# Patient Record
Sex: Female | Born: 1945 | Race: White | Hispanic: No | Marital: Married | Smoking: Never smoker
Health system: Southern US, Community
[De-identification: ages and names within clinical notes are randomized; demographics above are authoritative.]

## PROBLEM LIST (undated history)

## (undated) DIAGNOSIS — R200 Anesthesia of skin: Secondary | ICD-10-CM

## (undated) DIAGNOSIS — E119 Type 2 diabetes mellitus without complications: Secondary | ICD-10-CM

## (undated) DIAGNOSIS — D6851 Activated protein C resistance: Secondary | ICD-10-CM

## (undated) DIAGNOSIS — F32A Depression, unspecified: Secondary | ICD-10-CM

## (undated) DIAGNOSIS — Z9889 Other specified postprocedural states: Secondary | ICD-10-CM

## (undated) DIAGNOSIS — N3281 Overactive bladder: Secondary | ICD-10-CM

## (undated) DIAGNOSIS — R202 Paresthesia of skin: Secondary | ICD-10-CM

## (undated) DIAGNOSIS — G959 Disease of spinal cord, unspecified: Secondary | ICD-10-CM

## (undated) DIAGNOSIS — H43399 Other vitreous opacities, unspecified eye: Secondary | ICD-10-CM

## (undated) DIAGNOSIS — I4891 Unspecified atrial fibrillation: Secondary | ICD-10-CM

## (undated) DIAGNOSIS — R112 Nausea with vomiting, unspecified: Secondary | ICD-10-CM

## (undated) DIAGNOSIS — G4730 Sleep apnea, unspecified: Secondary | ICD-10-CM

## (undated) DIAGNOSIS — M1990 Unspecified osteoarthritis, unspecified site: Secondary | ICD-10-CM

## (undated) DIAGNOSIS — R51 Headache: Secondary | ICD-10-CM

## (undated) DIAGNOSIS — F419 Anxiety disorder, unspecified: Secondary | ICD-10-CM

## (undated) DIAGNOSIS — K219 Gastro-esophageal reflux disease without esophagitis: Secondary | ICD-10-CM

## (undated) DIAGNOSIS — M810 Age-related osteoporosis without current pathological fracture: Secondary | ICD-10-CM

## (undated) DIAGNOSIS — G4700 Insomnia, unspecified: Secondary | ICD-10-CM

## (undated) DIAGNOSIS — N390 Urinary tract infection, site not specified: Secondary | ICD-10-CM

## (undated) DIAGNOSIS — D649 Anemia, unspecified: Secondary | ICD-10-CM

## (undated) DIAGNOSIS — I4510 Unspecified right bundle-branch block: Secondary | ICD-10-CM

## (undated) HISTORY — PX: COLONOSCOPY: SHX174

## (undated) HISTORY — PX: ABDOMINAL HYSTERECTOMY: SHX81

## (undated) HISTORY — PX: DILATION AND CURETTAGE OF UTERUS: SHX78

## (undated) HISTORY — PX: TONSILLECTOMY: SUR1361

## (undated) HISTORY — DX: Disease of spinal cord, unspecified: G95.9

## (undated) HISTORY — PX: REPLACEMENT TOTAL KNEE: SUR1224

## (undated) HISTORY — PX: UPPER GI ENDOSCOPY: SHX6162

## (undated) HISTORY — DX: Age-related osteoporosis without current pathological fracture: M81.0

---

## 2012-10-07 ENCOUNTER — Ambulatory Visit (HOSPITAL_COMMUNITY)
Admission: RE | Admit: 2012-10-07 | Discharge: 2012-10-07 | Disposition: A | Discharge status: Home / Self Care | Payer: Medicare Other | Source: Ambulatory Visit | Attending: Cardiovascular Disease | Admitting: Cardiovascular Disease

## 2012-10-07 ENCOUNTER — Other Ambulatory Visit (HOSPITAL_COMMUNITY): Payer: Self-pay | Admitting: Physical Medicine and Rehabilitation

## 2012-10-07 DIAGNOSIS — M7989 Other specified soft tissue disorders: Secondary | ICD-10-CM | POA: Insufficient documentation

## 2012-10-07 DIAGNOSIS — M25569 Pain in unspecified knee: Secondary | ICD-10-CM | POA: Insufficient documentation

## 2012-10-07 DIAGNOSIS — M25561 Pain in right knee: Secondary | ICD-10-CM

## 2012-10-07 NOTE — Progress Notes (Signed)
Venous Duplex Limited Completed. Negative. Vickie Ward

## 2013-03-12 ENCOUNTER — Encounter (HOSPITAL_COMMUNITY): Payer: Self-pay | Admitting: Pharmacy Technician

## 2013-03-12 NOTE — H&P (Signed)
History of Present Illness The patient is a 67 year old female who presents with neck pain. The patient is seen today Patient is here to discuss her Cervical MRI scan. The patient reports symptoms involving the left posterior neck which began 2 year(s) (more than 2 years) ago. The symptoms began without any known injury. Symptoms include neck pain (left sided posterior neck pain, left arm pain, BURNING in left arm in left elbow and moves into base of left index finger), shoulder pain (left shoulder soreness) and headaches (bad headaches every afternoon). The patient describes the pain as sharp, dull and burning.The patient describes their symptoms as moderate in severity.The patient does feel that the symptoms are worsening. Current treatment includes nonsteroidal anti-inflammatory drugs (Tylenol for headaches, when patient raises left arm above head this helps to relieve pain).   Progressive neck pain  The patient states that over the last two years she has had worsening neck pain, headaches and loss in quality of life. She states she saw a neurosurgeon two years ago who recommended a multilevel cervical fusion procedure for degenerative disease. She did not have this done and she presents to me today for another opinion for evaluation and treatment of her neck pain.     Allergies MACROBID. 09/05/2010 CODEINE. 09/05/2010 KEFLEX. 09/05/2010    Social History Never smoker Marital status. married Living situation. live with spouse Number of flights of stairs before winded. 2-3 Tobacco use. Never smoker. never smoker Tobacco / smoke exposure. no Pain Contract. no Current work status. working full time Children. 2 Alcohol use. current drinker; drinks wine; less than 5 per week Drug/Alcohol Rehab (Currently). no Illicit drug use. no Exercise. Exercises monthly; does running / walking Drug/Alcohol Rehab (Previously). no    Medication History Calcium  Carbonate (1250MG  Tablet, 1 Oral daily) Active. Vitamin D3 (5000UNIT Capsule, 1 Oral daily) Active. Osteoporosis Reclasp IV Injection done once a year (1 yearly) Active. Ambien (10MG  Tablet, Oral) Active. (QHS) MetFORMIN HCl ER (OSM) ( Oral) Specific dose unknown - Active. (QD) VESIcare ( Oral) Specific dose unknown - Active. (QD) Meclizine HCl (25MG  Tablet Chewable, Oral) Active. (PRN) Ramipril (2.5MG  Capsule, Oral) Active. (QD) Medications Reconciled.    Objective Transcription  She is a pleasant young woman who appears younger than her stated age. She is alert and oriented times three. No shortness of breath or chest pain. The abdomen is soft, nontender. She is able to heel toe walk without any deep disturbance. She has upgoing toes on Babinski testing, unequivocal Hoffmann sign, no clonus. Brisk lower extremity deep tendon reflexes at the knee and Achilles, 1+ deep tendon reflexes in the upper extremities in biceps and brachioradialis. Triceps is more of a 2+ reflex. She has mild to moderate neck pain with palpation and range of motion. No incontinence of bowel or bladder. No obvious skin lesions, abrasions or contusions.    RADIOGRAPHS:  Her MRI done 01/21/13 demonstrate cord signal compression at C4-5 consistent with myelomalacia, multilevel degenerative disease at C4-5, C5-6, C6-7, moderate disease at C3-4, minimal disease at C7-T1.   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 All of their questions were addressed. Unfortunately the natural history of cervical myelopathy is one of progression in a step wise fashion. Currently she does not have any significant proximal lower extremity weakness but I am concerned  that this could change. At this point in time my recommendation is  a surgical solution for her neck. Nonoperative treatment has not been shown in the literature to positively impact this disease process. The natural history is one of progression not resolution. The risks of surgery include infection, bleeding, nerve damage, death, stroke, paralysis, failure to heal, need for further surgery, throat pain, swallowing difficulties, hoarseness in the voice, nonunion meaning it does not use and need for posterior supplemental fixation. The goal of surgery is to prevent the progression of the disease process not to improve her overall clinical symptoms. While improvement can occur I have indicated to her and her husband that the goal is to prevent this from getting worse. All of their questions were encouraged and addressed. I have asked them to think about what we have talked about and to let me know if they would like to proceed with surgery. If we do the procedure would be a 4 to 7 instrumented fusion, ACDF.

## 2013-03-15 ENCOUNTER — Ambulatory Visit (HOSPITAL_COMMUNITY)
Admission: RE | Admit: 2013-03-15 | Discharge: 2013-03-15 | Disposition: A | Discharge status: Home / Self Care | Payer: Medicare Other | Source: Ambulatory Visit | Attending: Orthopedic Surgery | Admitting: Orthopedic Surgery

## 2013-03-15 ENCOUNTER — Encounter (HOSPITAL_COMMUNITY)
Admission: RE | Admit: 2013-03-15 | Discharge: 2013-03-15 | Disposition: A | Discharge status: Home / Self Care | Payer: Medicare Other | Source: Ambulatory Visit | Attending: Orthopedic Surgery | Admitting: Orthopedic Surgery

## 2013-03-15 ENCOUNTER — Encounter (HOSPITAL_COMMUNITY): Payer: Self-pay

## 2013-03-15 DIAGNOSIS — D649 Anemia, unspecified: Secondary | ICD-10-CM | POA: Insufficient documentation

## 2013-03-15 DIAGNOSIS — K219 Gastro-esophageal reflux disease without esophagitis: Secondary | ICD-10-CM | POA: Insufficient documentation

## 2013-03-15 DIAGNOSIS — G4733 Obstructive sleep apnea (adult) (pediatric): Secondary | ICD-10-CM | POA: Insufficient documentation

## 2013-03-15 DIAGNOSIS — D6859 Other primary thrombophilia: Secondary | ICD-10-CM | POA: Insufficient documentation

## 2013-03-15 DIAGNOSIS — E119 Type 2 diabetes mellitus without complications: Secondary | ICD-10-CM | POA: Insufficient documentation

## 2013-03-15 DIAGNOSIS — M542 Cervicalgia: Secondary | ICD-10-CM | POA: Insufficient documentation

## 2013-03-15 DIAGNOSIS — E669 Obesity, unspecified: Secondary | ICD-10-CM | POA: Insufficient documentation

## 2013-03-15 HISTORY — DX: Unspecified osteoarthritis, unspecified site: M19.90

## 2013-03-15 HISTORY — DX: Anesthesia of skin: R20.0

## 2013-03-15 HISTORY — DX: Anemia, unspecified: D64.9

## 2013-03-15 HISTORY — DX: Type 2 diabetes mellitus without complications: E11.9

## 2013-03-15 HISTORY — DX: Other specified postprocedural states: Z98.890

## 2013-03-15 HISTORY — DX: Sleep apnea, unspecified: G47.30

## 2013-03-15 HISTORY — DX: Other vitreous opacities, unspecified eye: H43.399

## 2013-03-15 HISTORY — DX: Headache: R51

## 2013-03-15 HISTORY — DX: Insomnia, unspecified: G47.00

## 2013-03-15 HISTORY — DX: Anesthesia of skin: R20.2

## 2013-03-15 HISTORY — DX: Gastro-esophageal reflux disease without esophagitis: K21.9

## 2013-03-15 HISTORY — DX: Overactive bladder: N32.81

## 2013-03-15 HISTORY — DX: Activated protein C resistance: D68.51

## 2013-03-15 HISTORY — DX: Other specified postprocedural states: R11.2

## 2013-03-15 HISTORY — DX: Urinary tract infection, site not specified: N39.0

## 2013-03-15 NOTE — Pre-Procedure Instructions (Signed)
Vickie Ward  03/15/2013   Your procedure is scheduled on:  Thurs, Nov 20 @ 7:30 AM  Report to [facility] Stay [facility]trance A at 5:30 AM.  Call this number if you have problems the morning of surgery: [phone]   Remember:   Do not eat food or drink liquids after midnight.   Take these medicines the morning of surgery with A SIP OF WATER: Vesicare(Solifenacin)               No Goody's,BC's,Aleve,Aspirin,Ibuprofen,Fish Oil,or any Herbal Medications   Do not wear jewelry, make-up or nail polish.  Do not wear lotions, powders, or perfumes. You may wear deodorant.  Do not shave 48 hours prior to surgery.   Do not bring valuables to the hospital.  [facility] is not responsible                  for any belongings or valuables.               Contacts, dentures or bridgework may not be worn into surgery.  Leave suitcase in the car. After surgery it may be brought to your room.  For patients admitted to the hospital, discharge time is determined by your                treatment team.               Patients discharged the day of surgery will not be allowed to drive  home.    Special Instructions: Shower using CHG 2 nights before surgery and the night before surgery.  If you shower the day of surgery use CHG.  Use special wash - you have one bottle of CHG for all showers.  You should use approximately 1/3 of the bottle for each shower.   Please read over the following fact sheets that you were given: Pain Booklet, Coughing and Deep Breathing, MRSA Information and Surgical Site Infection Prevention

## 2013-03-15 NOTE — Progress Notes (Signed)
Pt reports that PCP is Dr. Madelyn Flavors.  Pt denies having a Cardiologist but will see Dr. Hyacinth Meeker [phone] for stress test tomorrow at noon which was ordered by PCP. Pt reports that she was told her EKG was abnormal so PCP set up stress test as part of clearance for surgery.  Pt had EKG, CXR and blood work done 03/08/13 by PCP, records requested.  Pt denies having Hypertension, reports taking Ramipril for kidney function due to Diabetes.

## 2013-03-16 ENCOUNTER — Encounter (HOSPITAL_COMMUNITY): Payer: Self-pay

## 2013-03-16 NOTE — Progress Notes (Addendum)
Anesthesia Chart Review:  Patient is a 67 year old female scheduled for C4-7 ACDF on 03/18/13 by Dr. Shon Baton.  History includes obesity, non-smoker, DM2, Factor V Leiden deficiency (heterozygous for mutation), arthritis, headaches, GERD, anemia, OSA, overactive bladder, gastric bypass '07. PCP is Dr. Madelyn Flavors.  She is having a preoperative stress test with Dr. Hyacinth Meeker today that was ordered by her PCP as part of surgical clearance. She has never had a personal history of DVT, but has been seen by hematologist Dr. Zenovia Jarred with [facility] in the past and taken Lovenox SQ post-operatively for up to one month.  CXR on 03/12/13 (PCP) showed no evidence of cardiomegaly or CHF.  Lungs well expanded and free of infiltrate, nodule, mass, or pleural effusion.  Bones and mediastinum are normal.   EKG on 03/12/13 (PCP) showed SB @ 54 bpm, right BBB, LAFB, new T wave inversion laterally. (I question if there is limb lead reversal because aVF is positive.)   Labs from 03/08/13 (PCP) showed normal liver function profile, H/H 12.0/37.7, PLT 225K, A1C 6.73, Na 137, K 4.2, glucose 119, BUN 18, Cr 0.62.  Urine culture showed E. Coli, and she was treated with Cipro.  Will follow-up stress test results once available.  Vickie Ward [name] 03/16/2013 11:38 AM  Addendum: 03/17/2013 10:45 Nuclear stress test done at [facility] on 03/16/13 showed normal myocardial perfusion with no evidence of ischemia or prior infarction, normal LV size and systolic function with no regional wall motion abnormalities. Patient was subsequently cleared by her PCP Dr. Tiburcio Pea.

## 2013-03-17 MED ORDER — VANCOMYCIN HCL IN DEXTROSE 1-5 GM/200ML-% IV SOLN
1000.0000 mg | Freq: Two times a day (BID) | INTRAVENOUS | Status: DC
  Administered 2013-03-18: 1000 mg via INTRAVENOUS
  Filled 2013-03-17: qty 200

## 2013-03-18 ENCOUNTER — Inpatient Hospital Stay (HOSPITAL_COMMUNITY): Payer: Medicare Other

## 2013-03-18 ENCOUNTER — Inpatient Hospital Stay (HOSPITAL_COMMUNITY)
Admission: RE | Admit: 2013-03-18 | Discharge: 2013-03-19 | DRG: 472 | Disposition: A | Discharge status: Home / Self Care | Payer: Medicare Other | Source: Ambulatory Visit | Attending: Orthopedic Surgery | Admitting: Orthopedic Surgery

## 2013-03-18 ENCOUNTER — Encounter (HOSPITAL_COMMUNITY)
Admission: RE | Disposition: A | Discharge status: Home / Self Care | Payer: Self-pay | Source: Ambulatory Visit | Attending: Orthopedic Surgery

## 2013-03-18 ENCOUNTER — Inpatient Hospital Stay (HOSPITAL_COMMUNITY): Payer: Medicare Other | Admitting: Anesthesiology

## 2013-03-18 ENCOUNTER — Encounter (HOSPITAL_COMMUNITY): Payer: Self-pay | Admitting: *Deleted

## 2013-03-18 ENCOUNTER — Encounter (HOSPITAL_COMMUNITY): Payer: Medicare Other | Admitting: Vascular Surgery

## 2013-03-18 DIAGNOSIS — M4712 Other spondylosis with myelopathy, cervical region: Principal | ICD-10-CM | POA: Diagnosis present

## 2013-03-18 DIAGNOSIS — Z981 Arthrodesis status: Secondary | ICD-10-CM

## 2013-03-18 DIAGNOSIS — E119 Type 2 diabetes mellitus without complications: Secondary | ICD-10-CM | POA: Diagnosis present

## 2013-03-18 DIAGNOSIS — M810 Age-related osteoporosis without current pathological fracture: Secondary | ICD-10-CM | POA: Diagnosis present

## 2013-03-18 DIAGNOSIS — K219 Gastro-esophageal reflux disease without esophagitis: Secondary | ICD-10-CM | POA: Diagnosis present

## 2013-03-18 DIAGNOSIS — M5000 Cervical disc disorder with myelopathy, unspecified cervical region: Secondary | ICD-10-CM | POA: Diagnosis present

## 2013-03-18 DIAGNOSIS — G4730 Sleep apnea, unspecified: Secondary | ICD-10-CM | POA: Diagnosis present

## 2013-03-18 HISTORY — PX: ANTERIOR CERVICAL DECOMP/DISCECTOMY FUSION: SHX1161

## 2013-03-18 LAB — GLUCOSE, CAPILLARY
Glucose-Capillary: 117 mg/dL — ABNORMAL HIGH (ref 70–99)
Glucose-Capillary: 277 mg/dL — ABNORMAL HIGH (ref 70–99)

## 2013-03-18 LAB — HEMOGLOBIN A1C: Mean Plasma Glucose: 146 mg/dL — ABNORMAL HIGH (ref <117)

## 2013-03-18 SURGERY — ANTERIOR CERVICAL DECOMPRESSION/DISCECTOMY FUSION 3 LEVELS
Anesthesia: General | Site: Spine Cervical | Wound class: Clean

## 2013-03-18 MED ORDER — PHENOL 1.4 % MT LIQD: 1.0000 | OROMUCOSAL | Status: DC | PRN

## 2013-03-18 MED ORDER — LACTATED RINGERS IV SOLN
INTRAVENOUS | Status: DC
  Administered 2013-03-19: via INTRAVENOUS

## 2013-03-18 MED ORDER — THROMBIN 20000 UNITS EX SOLR
CUTANEOUS | Status: DC | PRN
  Administered 2013-03-18: 08:00:00 via TOPICAL

## 2013-03-18 MED ORDER — RAMIPRIL 2.5 MG PO CAPS
2.5000 mg | ORAL_CAPSULE | Freq: Every day | ORAL | Status: DC
  Filled 2013-03-18 (×2): qty 1

## 2013-03-18 MED ORDER — METFORMIN HCL ER 500 MG PO TB24
500.0000 mg | ORAL_TABLET | Freq: Every day | ORAL | Status: DC
  Administered 2013-03-19: 500 mg via ORAL
  Filled 2013-03-18 (×2): qty 1

## 2013-03-18 MED ORDER — LACTATED RINGERS IV SOLN
INTRAVENOUS | Status: DC | PRN
  Administered 2013-03-18 (×2): via INTRAVENOUS

## 2013-03-18 MED ORDER — MORPHINE SULFATE 2 MG/ML IJ SOLN: 1.0000 mg | INTRAMUSCULAR | Status: DC | PRN

## 2013-03-18 MED ORDER — METHOCARBAMOL 100 MG/ML IJ SOLN
500.0000 mg | Freq: Four times a day (QID) | INTRAVENOUS | Status: DC | PRN
  Filled 2013-03-18: qty 5

## 2013-03-18 MED ORDER — HYDROMORPHONE HCL PF 1 MG/ML IJ SOLN
INTRAMUSCULAR | Status: AC
  Filled 2013-03-18: qty 1

## 2013-03-18 MED ORDER — FENTANYL CITRATE 0.05 MG/ML IJ SOLN
INTRAMUSCULAR | Status: DC | PRN
  Administered 2013-03-18: 100 ug via INTRAVENOUS
  Administered 2013-03-18 (×3): 50 ug via INTRAVENOUS

## 2013-03-18 MED ORDER — METHOCARBAMOL 500 MG PO TABS
500.0000 mg | ORAL_TABLET | Freq: Four times a day (QID) | ORAL | Status: DC | PRN
  Administered 2013-03-19 (×2): 500 mg via ORAL
  Filled 2013-03-18 (×3): qty 1

## 2013-03-18 MED ORDER — HEMOSTATIC AGENTS (NO CHARGE) OPTIME
TOPICAL | Status: DC | PRN
  Administered 2013-03-18: 1 via TOPICAL

## 2013-03-18 MED ORDER — 0.9 % SODIUM CHLORIDE (POUR BTL) OPTIME
TOPICAL | Status: DC | PRN
  Administered 2013-03-18: 1000 mL

## 2013-03-18 MED ORDER — DEXAMETHASONE SODIUM PHOSPHATE 4 MG/ML IJ SOLN: 4.0000 mg | Freq: Once | INTRAMUSCULAR | Status: DC

## 2013-03-18 MED ORDER — THROMBIN 20000 UNITS EX SOLR
CUTANEOUS | Status: AC
  Filled 2013-03-18: qty 20000

## 2013-03-18 MED ORDER — DEXAMETHASONE SODIUM PHOSPHATE 4 MG/ML IJ SOLN
INTRAMUSCULAR | Status: AC
  Filled 2013-03-18: qty 1

## 2013-03-18 MED ORDER — ACETAMINOPHEN 10 MG/ML IV SOLN
1000.0000 mg | Freq: Four times a day (QID) | INTRAVENOUS | Status: DC
  Administered 2013-03-18 – 2013-03-19 (×3): 1000 mg via INTRAVENOUS
  Filled 2013-03-18 (×4): qty 100

## 2013-03-18 MED ORDER — MENTHOL 3 MG MT LOZG: 1.0000 | LOZENGE | OROMUCOSAL | Status: DC | PRN

## 2013-03-18 MED ORDER — SODIUM CHLORIDE 0.9 % IJ SOLN: 3.0000 mL | INTRAMUSCULAR | Status: DC | PRN

## 2013-03-18 MED ORDER — ONDANSETRON HCL 4 MG/2ML IJ SOLN
INTRAMUSCULAR | Status: DC | PRN
  Administered 2013-03-18 (×2): 4 mg via INTRAVENOUS

## 2013-03-18 MED ORDER — VANCOMYCIN HCL IN DEXTROSE 1-5 GM/200ML-% IV SOLN
1000.0000 mg | Freq: Two times a day (BID) | INTRAVENOUS | Status: AC
  Administered 2013-03-18 – 2013-03-19 (×2): 1000 mg via INTRAVENOUS
  Filled 2013-03-18 (×2): qty 200

## 2013-03-18 MED ORDER — DOCUSATE SODIUM 100 MG PO CAPS
100.0000 mg | ORAL_CAPSULE | Freq: Two times a day (BID) | ORAL | Status: DC
  Administered 2013-03-18: 100 mg via ORAL
  Filled 2013-03-18 (×3): qty 1

## 2013-03-18 MED ORDER — ONDANSETRON HCL 4 MG/2ML IJ SOLN
4.0000 mg | INTRAMUSCULAR | Status: DC | PRN
  Administered 2013-03-18 – 2013-03-19 (×2): 4 mg via INTRAVENOUS
  Filled 2013-03-18 (×2): qty 2

## 2013-03-18 MED ORDER — PHENYLEPHRINE HCL 10 MG/ML IJ SOLN
INTRAMUSCULAR | Status: DC | PRN
  Administered 2013-03-18 (×2): 80 ug via INTRAVENOUS

## 2013-03-18 MED ORDER — ZOLPIDEM TARTRATE 5 MG PO TABS
5.0000 mg | ORAL_TABLET | Freq: Every day | ORAL | Status: DC
  Administered 2013-03-18: 5 mg via ORAL
  Filled 2013-03-18: qty 1

## 2013-03-18 MED ORDER — ONDANSETRON HCL 4 MG/2ML IJ SOLN: 4.0000 mg | Freq: Once | INTRAMUSCULAR | Status: DC | PRN

## 2013-03-18 MED ORDER — EPHEDRINE SULFATE 50 MG/ML IJ SOLN
INTRAMUSCULAR | Status: DC | PRN
  Administered 2013-03-18 (×2): 10 mg via INTRAVENOUS

## 2013-03-18 MED ORDER — ACETAMINOPHEN 10 MG/ML IV SOLN
1000.0000 mg | INTRAVENOUS | Status: AC
  Filled 2013-03-18: qty 100

## 2013-03-18 MED ORDER — HYDROMORPHONE HCL PF 1 MG/ML IJ SOLN: 0.2500 mg | INTRAMUSCULAR | Status: DC | PRN

## 2013-03-18 MED ORDER — DEXMEDETOMIDINE HCL IN NACL 400 MCG/100ML IV SOLN
0.4000 ug/kg/h | INTRAVENOUS | Status: DC
  Filled 2013-03-18: qty 100

## 2013-03-18 MED ORDER — DEXMEDETOMIDINE HCL IN NACL 200 MCG/50ML IV SOLN
INTRAVENOUS | Status: DC | PRN
  Administered 2013-03-18: 0.7 ug/kg/h via INTRAVENOUS

## 2013-03-18 MED ORDER — MIDAZOLAM HCL 5 MG/5ML IJ SOLN
INTRAMUSCULAR | Status: DC | PRN
  Administered 2013-03-18: 2 mg via INTRAVENOUS

## 2013-03-18 MED ORDER — GLYCOPYRROLATE 0.2 MG/ML IJ SOLN
INTRAMUSCULAR | Status: DC | PRN
  Administered 2013-03-18: 0.4 mg via INTRAVENOUS

## 2013-03-18 MED ORDER — ROCURONIUM BROMIDE 100 MG/10ML IV SOLN
INTRAVENOUS | Status: DC | PRN
  Administered 2013-03-18: 50 mg via INTRAVENOUS

## 2013-03-18 MED ORDER — SODIUM CHLORIDE 0.9 % IV SOLN: 250.0000 mL | INTRAVENOUS | Status: DC

## 2013-03-18 MED ORDER — LIDOCAINE HCL (CARDIAC) 20 MG/ML IV SOLN
INTRAVENOUS | Status: DC | PRN
  Administered 2013-03-18: 40 mg via INTRAVENOUS

## 2013-03-18 MED ORDER — METHOCARBAMOL 500 MG PO TABS
ORAL_TABLET | ORAL | Status: AC
  Administered 2013-03-18: 500 mg
  Filled 2013-03-18: qty 1

## 2013-03-18 MED ORDER — SODIUM CHLORIDE 0.9 % IJ SOLN: 3.0000 mL | Freq: Two times a day (BID) | INTRAMUSCULAR | Status: DC

## 2013-03-18 MED ORDER — OXYCODONE HCL 5 MG PO TABS: 10.0000 mg | ORAL_TABLET | ORAL | Status: DC | PRN

## 2013-03-18 MED ORDER — FLEET ENEMA 7-19 GM/118ML RE ENEM: 1.0000 | ENEMA | Freq: Once | RECTAL | Status: AC | PRN

## 2013-03-18 MED ORDER — BUPIVACAINE-EPINEPHRINE 0.25% -1:200000 IJ SOLN
INTRAMUSCULAR | Status: DC | PRN
  Administered 2013-03-18: 3.5 mL

## 2013-03-18 MED ORDER — PROPOFOL 10 MG/ML IV BOLUS
INTRAVENOUS | Status: DC | PRN
  Administered 2013-03-18: 140 mg via INTRAVENOUS

## 2013-03-18 MED ORDER — INSULIN ASPART 100 UNIT/ML ~~LOC~~ SOLN
0.0000 [IU] | SUBCUTANEOUS | Status: DC
  Administered 2013-03-18: 8 [IU] via SUBCUTANEOUS
  Administered 2013-03-18: 2 [IU] via SUBCUTANEOUS
  Administered 2013-03-19: 3 [IU] via SUBCUTANEOUS

## 2013-03-18 MED ORDER — SCOPOLAMINE 1 MG/3DAYS TD PT72
1.0000 | MEDICATED_PATCH | TRANSDERMAL | Status: AC
  Administered 2013-03-18: 1.5 mg via TRANSDERMAL
  Filled 2013-03-18: qty 1

## 2013-03-18 MED ORDER — ACETAMINOPHEN 10 MG/ML IV SOLN
1000.0000 mg | Freq: Four times a day (QID) | INTRAVENOUS | Status: DC
  Administered 2013-03-18: 1000 mg via INTRAVENOUS

## 2013-03-18 SURGICAL SUPPLY — 66 items
BLADE SURG ROTATE 9660 (MISCELLANEOUS) IMPLANT
BUR EGG ELITE 4.0 (BURR) ×2 IMPLANT
BUR MATCHSTICK NEURO 3.0 LAGG (BURR) ×2 IMPLANT
CANISTER SUCTION 2500CC (MISCELLANEOUS) ×2 IMPLANT
CLOTH BEACON ORANGE TIMEOUT ST (SAFETY) ×2 IMPLANT
CLSR STERI-STRIP ANTIMIC 1/2X4 (GAUZE/BANDAGES/DRESSINGS) ×2 IMPLANT
CORDS BIPOLAR (ELECTRODE) ×2 IMPLANT
COVER SURGICAL LIGHT HANDLE (MISCELLANEOUS) ×2 IMPLANT
CRADLE DONUT ADULT HEAD (MISCELLANEOUS) ×2 IMPLANT
DEVICE ENDSKLTN IMPLNT MED 6MM (Orthopedic Implant) ×1 IMPLANT
DEVICE ENDSKLTN MED 6 7MM (Orthopedic Implant) ×2 IMPLANT
DRAPE C-ARM 42X72 X-RAY (DRAPES) ×2 IMPLANT
DRAPE POUCH INSTRU U-SHP 10X18 (DRAPES) ×2 IMPLANT
DRAPE SURG 17X23 STRL (DRAPES) ×2 IMPLANT
DRAPE U-SHAPE 47X51 STRL (DRAPES) ×2 IMPLANT
DRILL SWIFT 14MM (DRILL) ×2 IMPLANT
DRSG MEPILEX BORDER 4X4 (GAUZE/BANDAGES/DRESSINGS) ×2 IMPLANT
DRSG MEPILEX BORDER 4X8 (GAUZE/BANDAGES/DRESSINGS) ×2 IMPLANT
DURAPREP 26ML APPLICATOR (WOUND CARE) ×2 IMPLANT
ELECT COATED BLADE 2.86 ST (ELECTRODE) ×2 IMPLANT
ELECT REM PT RETURN 9FT ADLT (ELECTROSURGICAL) ×2
ELECTRODE REM PT RTRN 9FT ADLT (ELECTROSURGICAL) ×1 IMPLANT
ENDOSKELETON IMPLANT MED 6MM (Orthopedic Implant) ×2 IMPLANT
ENDOSKELETON MED 6 7MM (Orthopedic Implant) ×4 IMPLANT
GLOVE BIOGEL PI IND STRL 8 (GLOVE) ×1 IMPLANT
GLOVE BIOGEL PI IND STRL 8.5 (GLOVE) ×1 IMPLANT
GLOVE BIOGEL PI INDICATOR 8 (GLOVE) ×1
GLOVE BIOGEL PI INDICATOR 8.5 (GLOVE) ×1
GLOVE ECLIPSE 8.5 STRL (GLOVE) ×2 IMPLANT
GLOVE ORTHO TXT STRL SZ7.5 (GLOVE) ×2 IMPLANT
GOWN PREVENTION PLUS XXLARGE (GOWN DISPOSABLE) ×2 IMPLANT
GOWN STRL REIN 2XL XLG LVL4 (GOWN DISPOSABLE) ×2 IMPLANT
GOWN STRL REIN XL XLG (GOWN DISPOSABLE) ×4 IMPLANT
KIT BASIN OR (CUSTOM PROCEDURE TRAY) ×2 IMPLANT
KIT ROOM TURNOVER OR (KITS) ×2 IMPLANT
MIX DBX 10CC 35% BONE (Bone Implant) ×2 IMPLANT
NEEDLE SPNL 18GX3.5 QUINCKE PK (NEEDLE) ×4 IMPLANT
NS IRRIG 1000ML POUR BTL (IV SOLUTION) ×4 IMPLANT
PACK ORTHO CERVICAL (CUSTOM PROCEDURE TRAY) ×2 IMPLANT
PACK UNIVERSAL I (CUSTOM PROCEDURE TRAY) ×2 IMPLANT
PAD ARMBOARD 7.5X6 YLW CONV (MISCELLANEOUS) ×6 IMPLANT
PATTIES SURGICAL .25X.25 (GAUZE/BANDAGES/DRESSINGS) ×2 IMPLANT
PATTIES SURGICAL .5 X.5 (GAUZE/BANDAGES/DRESSINGS) IMPLANT
PIN RETAINER PRODISC 14 MM (PIN) ×2 IMPLANT
PLATE SWIFT 54MM 3LVL (Plate) ×2 IMPLANT
RESTRAINT LIMB HOLDER UNIV (RESTRAINTS) ×2 IMPLANT
SCREW SD-VA 14M SWIFT PLUS (Screw) ×4 IMPLANT
SCREW SELF TAP EAGLE PLUS 14MM (Screw) ×8 IMPLANT
SCREW SWIFT PLUS 16MM RESCUE (Screw) ×4 IMPLANT
SPONGE INTESTINAL PEANUT (DISPOSABLE) ×4 IMPLANT
SPONGE LAP 4X18 X RAY DECT (DISPOSABLE) ×4 IMPLANT
SPONGE SURGIFOAM ABS GEL 100 (HEMOSTASIS) ×2 IMPLANT
SURGIFLO TRUKIT (HEMOSTASIS) ×2 IMPLANT
SUT MON AB 3-0 SH 27 (SUTURE) ×1
SUT MON AB 3-0 SH27 (SUTURE) ×1 IMPLANT
SUT SILK 2 0 (SUTURE) ×1
SUT SILK 2-0 18XBRD TIE 12 (SUTURE) ×1 IMPLANT
SUT VIC AB 2-0 CT1 18 (SUTURE) ×2 IMPLANT
SYR BULB IRRIGATION 50ML (SYRINGE) ×2 IMPLANT
SYR CONTROL 10ML LL (SYRINGE) ×2 IMPLANT
TAPE CLOTH 4X10 WHT NS (GAUZE/BANDAGES/DRESSINGS) ×2 IMPLANT
TAPE UMBILICAL COTTON 1/8X30 (MISCELLANEOUS) ×2 IMPLANT
TOWEL OR 17X24 6PK STRL BLUE (TOWEL DISPOSABLE) ×2 IMPLANT
TOWEL OR 17X26 10 PK STRL BLUE (TOWEL DISPOSABLE) ×2 IMPLANT
TRAY FOLEY CATH 16FRSI W/METER (SET/KITS/TRAYS/PACK) ×2 IMPLANT
WATER STERILE IRR 1000ML POUR (IV SOLUTION) IMPLANT

## 2013-03-18 NOTE — Transfer of Care (Signed)
Immediate Anesthesia Transfer of Care Note  Patient: Vickie Ward  Procedure(s) Performed: Procedure(s): ACDF C4 - C7 3 LEVELS (N/A)  Patient Location: PACU  Anesthesia Type:General  Level of Consciousness: awake, alert  and oriented  Airway & Oxygen Therapy: Patient Spontanous Breathing and Patient connected to nasal cannula oxygen  Post-op Assessment: Report given to PACU RN and Post -op Vital signs reviewed and stable  Post vital signs: Reviewed and stable  Complications: No apparent anesthesia complications

## 2013-03-18 NOTE — Anesthesia Preprocedure Evaluation (Signed)
Anesthesia Evaluation  Patient identified by MRN, date of birth, ID band Patient awake    Reviewed: Allergy & Precautions, H&P , NPO status , Patient's Chart, lab work & pertinent test results  Airway Mallampati: II      Dental  (+) Teeth Intact and Dental Advisory Given   Pulmonary  breath sounds clear to auscultation        Cardiovascular Rhythm:Regular Rate:Normal     Neuro/Psych    GI/Hepatic   Endo/Other    Renal/GU      Musculoskeletal   Abdominal   Peds  Hematology   Anesthesia Other Findings   Reproductive/Obstetrics                           Anesthesia Physical Anesthesia Plan  ASA: II  Anesthesia Plan: General   Post-op Pain Management:    Induction: Intravenous  Airway Management Planned: Oral ETT  Additional Equipment:   Intra-op Plan:   Post-operative Plan: Extubation in OR  Informed Consent: I have reviewed the patients History and Physical, chart, labs and discussed the procedure including the risks, benefits and alternatives for the proposed anesthesia with the patient or authorized representative who has indicated his/her understanding and acceptance.   Dental advisory given  Plan Discussed with: CRNA and Anesthesiologist  Anesthesia Plan Comments: (Cervical spondylosis Type 2 DM glucose 119 H/O severe post-op nausea  S/P gastric bypass  Plan GA with oral ETT  Kipp Brood, MD)        Anesthesia Quick Evaluation

## 2013-03-18 NOTE — Brief Op Note (Signed)
n b packing in an in and out cath are no adverse cc is a slight versus was 03/18/2013  12:53 PM tubes lungs are  PATIENT:  Vickie Ward  67 y.o. female  PRE-OPERATIVE DIAGNOSIS:  Cervical Spondylotic Myelopathy  POST-OPERATIVE DIAGNOSIS:  Cervical Spondylotic Myelopathy  PROCEDURE:  Procedure(s): ACDF C4 - C7 3 LEVELS (N/A)  SURGEON:  Surgeon(s) and Role:    * Venita Lick, MD - Primary  PHYSICIAN ASSISTANT:   ASSISTANTS: Zonia Kief   ANESTHESIA:   general  EBL:  Total I/O In: 1700 [I.V.:1700] Out: 1150 [Urine:1000; Blood:150]  BLOOD ADMINISTERED:none  DRAINS: none   LOCAL MEDICATIONS USED:  MARCAINE     SPECIMEN:  No Specimen  DISPOSITION OF SPECIMEN:  N/A  COUNTS:  YES  TOURNIQUET:  * No tourniquets in log *  DICTATION: .Other Dictation: Dictation Number [id number]  PLAN OF CARE: Admit to inpatient   PATIENT DISPOSITION:  PACU - hemodynamically stable.

## 2013-03-18 NOTE — Anesthesia Postprocedure Evaluation (Signed)
  Anesthesia Post-op Note  Patient: Vickie Ward  Procedure(s) Performed: Procedure(s): ACDF C4 - C7 3 LEVELS (N/A)  Patient Location: PACU  Anesthesia Type:General  Level of Consciousness: awake, alert  and oriented  Airway and Oxygen Therapy: Patient Spontanous Breathing and Patient connected to nasal cannula oxygen  Post-op Pain: mild  Post-op Assessment: Post-op Vital signs reviewed, Patient's Cardiovascular Status Stable, Respiratory Function Stable, Patent Airway and Pain level controlled  Post-op Vital Signs: stable  Complications: No apparent anesthesia complications

## 2013-03-18 NOTE — Anesthesia Procedure Notes (Signed)
Procedure Name: Intubation Date/Time: 03/18/2013 7:40 AM Performed by: Arlice Colt B Pre-anesthesia Checklist: Patient identified, Emergency Drugs available, Suction available, Patient being monitored and Timeout performed Patient Re-evaluated:Patient Re-evaluated prior to inductionOxygen Delivery Method: Circle system utilized Preoxygenation: Pre-oxygenation with 100% oxygen Intubation Type: IV induction Ventilation: Mask ventilation without difficulty and Oral airway inserted - appropriate to patient size Laryngoscope Size: Mac and 3 Grade View: Grade I Tube type: Oral Tube size: 7.0 mm Number of attempts: 1 Airway Equipment and Method: Stylet Placement Confirmation: ETT inserted through vocal cords under direct vision,  positive ETCO2 and breath sounds checked- equal and bilateral Secured at: 21 cm Tube secured with: Tape Dental Injury: Teeth and Oropharynx as per pre-operative assessment

## 2013-03-18 NOTE — H&P (Signed)
No change to clinical exam H+P reviewed 

## 2013-03-18 NOTE — Progress Notes (Signed)
Utilization review completed.  

## 2013-03-19 LAB — GLUCOSE, CAPILLARY
Glucose-Capillary: 110 mg/dL — ABNORMAL HIGH (ref 70–99)
Glucose-Capillary: 193 mg/dL — ABNORMAL HIGH (ref 70–99)

## 2013-03-19 MED ORDER — METHOCARBAMOL 500 MG PO TABS: 500.0000 mg | ORAL_TABLET | Freq: Four times a day (QID) | ORAL | Status: DC | PRN

## 2013-03-19 MED ORDER — ONDANSETRON 4 MG PO TBDP: 4.0000 mg | ORAL_TABLET | Freq: Three times a day (TID) | ORAL | Status: DC | PRN

## 2013-03-19 MED ORDER — DOCUSATE SODIUM 100 MG PO CAPS: 100.0000 mg | ORAL_CAPSULE | Freq: Two times a day (BID) | ORAL | Status: DC

## 2013-03-19 MED ORDER — TRAMADOL HCL 50 MG PO TABS
50.0000 mg | ORAL_TABLET | Freq: Four times a day (QID) | ORAL | Status: DC | PRN
  Administered 2013-03-19: 50 mg via ORAL
  Filled 2013-03-19: qty 1

## 2013-03-19 MED ORDER — POLYETHYLENE GLYCOL 3350 17 G PO PACK: 17.0000 g | PACK | Freq: Every day | ORAL | Status: DC

## 2013-03-19 MED ORDER — TRAMADOL HCL 50 MG PO TABS: 50.0000 mg | ORAL_TABLET | Freq: Four times a day (QID) | ORAL | Status: DC | PRN

## 2013-03-19 NOTE — Progress Notes (Signed)
    Subjective: Procedure(s) (LRB): ACDF C4 - C7 3 LEVELS (N/A) 1 Day Post-Op  Patient reports pain as 2 on 0-10 scale.  Reports decreased arm pain reports incisional neck pain   Positive void Negative bowel movement Positive flatus Negative chest pain or shortness of breath  Objective: Vital signs in last 24 hours: Temp:  [97 F (36.1 C)-99.7 F (37.6 C)] 98.8 F (37.1 C) (11/21 0427) Pulse Rate:  [57-73] 69 (11/21 0427) Resp:  [13-19] 16 (11/21 0427) BP: (119-154)/(60-77) 145/64 mmHg (11/21 0427) SpO2:  [99 %-100 %] 100 % (11/21 0427) Weight:  [81.194 kg (179 lb)] 81.194 kg (179 lb) (11/20 0849)  Intake/Output from previous day: 11/20 0701 - 11/21 0700 In: 2440 [P.O.:240; I.V.:2200] Out: 4000 [Urine:3850; Blood:150]  Labs: No results found for this basename: WBC, RBC, HCT, PLT,  in the last 72 hours No results found for this basename: NA, K, CL, CO2, BUN, CREATININE, GLUCOSE, CALCIUM,  in the last 72 hours No results found for this basename: LABPT, INR,  in the last 72 hours  Physical Exam: Neurologically intact ABD soft Intact pulses distally Incision: dressing C/D/I and no drainage Compartment soft  Assessment/Plan: Patient stable  xrays satisfactory Mobilization with physical therapy Encourage incentive spirometry Continue care  Advance diet Up with therapy D/C IV fluids Plan on d/c today if doing well with PT  Venita Lick, MD [facility] [phone]

## 2013-03-19 NOTE — Op Note (Signed)
NAMEMAELI, SPACEK              ACCOUNT NO.:  [id number]  MEDICAL RECORD NO.:  [id number]  LOCATION:  5N22C                        FACILITY:  [facility]  PHYSICIAN:  Alvy Beal, MD    DATE OF BIRTH:  23-May-1945  DATE OF PROCEDURE:  03/18/2013 DATE OF DISCHARGE:                              OPERATIVE REPORT   PREOPERATIVE DIAGNOSIS:  Cervical spondylotic myelopathy.  POSTOPERATIVE DIAGNOSIS:  Cervical spondylotic myelopathy.  OPERATIVE PROCEDURE:  Anterior cervical diskectomy and fusion, C4 through C7.  COMPLICATIONS:  None.  CONDITION:  Stable.  INSTRUMENTATION:  Instrumentation system used was Titan titanium intervertebral cages packed with DBX mix.  I used a 7-mm medium cage at C4-5 and C6-7 and a 6-mm cage at C5-6 appeared, with a translation of DePuy anterior plate size 54 with appropriate sized locking screws.  HISTORY:  This is a very pleasant woman who has been complaining of severe neck with radicular burning arm pain on the left side and progressive instability with her gait, and clinical and radiographic analysis confirmed the diagnosis of cervical spondylitic myelopathy.  As a result, with the cord compression and progressive symptoms, we elected to proceed with surgery.  All appropriate risks, benefits, and alternatives were discussed with the patient and her husband, and consent was obtained.  OPERATIVE REPORT:  The patient was brought to the operating room, placed supine on the operating table.  After successful induction of general anesthesia and endotracheal intubation, TEDs, SCDs, and a Foley were inserted.  Rolled towels were placed between the shoulder blades.  The arms were secured down at the side and the anterior cervical spine was prepped and draped in a standard fashion.  Time-out was taken to confirm the patient, procedure, and all other pertinent important data.  Once this was done, a longitudinal standard Smith-Robinson approach was taken from  the left side.  I identified the C4 and C7 vertebral bodies with x- ray.  I infiltrated the proposed incision site with 0.25% Marcaine with epi.  I then made a longitudinal incision in line with sternocleidomastoid.  Sharp dissection was carried out down to the platysma.  The platysma was sharply incised and then I bluntly dissected along the medial border of sternocleidomastoid.  I identified the carotid sheath and protected it.  I swept the trachea and esophagus to the right and released the omohyoid from its sling.  Self-retaining retractors were placed, and I placed a needle into the C4-5 disk space. An x-ray was taken to confirm that I was at the appropriate level.  Once I confirmed that I was at the appropriate level, I then used a bipolar electrocautery to mobilize the longus coli muscles from the midbody of C4 to the midbody of C7.  This was done bilaterally.  Self-retaining retractor was placed at C6-7 disk space.  I deflated the endotracheal cuff.  I expanded the retractors to appropriate width and reinflated the cuff.  I then incised the annulus and then used pituitary rongeurs to begin my diskectomy.  I then placed distraction pins into the body of C6 and C7, and then gently distracted the space.  I continued to work posteriorly, removing all of the disk material.  I  then used a 1-mm Kerrison to release the posterior osteophyte.  I then used a fine nerve hook to develop a plane underneath the posterior longitudinal ligament and exploited this with my 1-mm Kerrison to resect the posterior longitudinal ligament.  I then was under the left-sided uncovertebral joint, and removed the bone spur.  This also was done with my 1-mm Kerrison.  I then rasped the endplates, measured with trial devices, and elected to place a 7 medium lordotic cage.  This cage was obtained, packed with DBX mix.  I made sure I had bleeding endplates and malleted the device to its appropriate depth.  I then  repositioned my retractor at the C4-5 level.  Sharp dissection was carried down.  Once the retractor was repositioned, I used the same technique that I had used at C6-7.  I removed all of the disk material, distracted the space, and took down the posterior longitudinal ligament. I then went underneath the uncovertebral joints with the Kerrison and removed the bone spur.  I had excellent decompression.  This was the most concerning level, this was where the cord signal changes occurring. I used the same size implant.  Once I completed, I repositioned the retractors at the C5-6 level.  I then used the same technique that I had employed at C4-5 and C6-7 level.  At this time, I used a smaller cage of size 6 medium lordotic.  Once I had all 3 cages properly positioned, I removed my distraction pins and placed appropriate-sized translational plate.  This was secured with self-drilling and self-tapping screws. All screws were properly torqued down.  The translational locking portions of the plate were removed.  I then irrigated the wound copiously with normal saline.  I then checked to ensure that the esophagus did not inadvertently become entrapped beneath the plate and had not.  After irrigating, I returned the trachea and esophagus to midline, closed the platysma with interrupted 2-0 Vicryl sutures and a 3- 0 Monocryl for the skin.  Steri-Strips and dry dressing were applied. The patient was extubated and transferred to the PACU without incident. At the end of the case, all needle and sponge counts were correct. There were no adverse intraoperative events.  ASSISTANT:  My first assistant, Genene Churn. Denton Meek.     Alvy Beal, MD     DDB/MEDQ  D:  03/18/2013  T:  03/19/2013  Job:  [id number]

## 2013-03-19 NOTE — Evaluation (Signed)
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 Pt provided with cervical precautions education, ADL A/E and tub bench transfer education. Pt to d/c home with her husband. All education completed and no further acute OT services indicated at this time    OT Assessment  Patient does not need any further OT services    Follow Up Recommendations  Supervision - Intermittent;No OT follow up    Barriers to Discharge  none    Equipment Recommendations  Tub/shower bench    Recommendations for Other Services    Frequency       Precautions / Restrictions Precautions Precautions: Cervical Precaution Comments: Pt provided with education and handout from PT Required Braces or Orthoses: Cervical Brace Cervical Brace: Hard collar Restrictions Weight Bearing Restrictions: No   Pertinent Vitals/Pain 2/10    ADL  Grooming: Performed;Wash/dry hands;Wash/dry face;Modified independent Where Assessed - Grooming: Unsupported standing Upper Body Bathing: Simulated;Modified independent Lower Body Bathing: Simulated;Modified independent Upper Body Dressing: Performed;Modified independent Lower Body Dressing: Performed;Modified independent Toilet Transfer: Performed;Modified independent Toilet Transfer Method: Sit to Barista: Regular height toilet;Grab bars Toileting - Clothing Manipulation and Hygiene: Performed;Modified independent Where Assessed - Toileting Clothing Manipulation and Hygiene: Standing Tub/Shower Transfer: Modified independent;Performed Film/video editor ADL Comments: pt provided with education and demo of ADL A/E and tub transfer bench, pt able to [employer]    OT Diagnosis:    OT Problem List:   OT Treatment Interventions:     OT Goals(Current goals can be found in the care plan section) Acute Rehab OT Goals Patient Stated Goal: to return home  Visit Information  Last OT Received On: 03/19/13 Assistance Needed: +1 History of Present Illness: Pt with Cervical Spondylotic Myelopathy, Anterior cervical diskectomy and fusion, 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       Prior Functioning     Home Living Family/patient expects to be discharged to:: Private residence Living Arrangements: Spouse/significant other Available Help at Discharge: Available 24 hours/day Type of Home: House Home Access: Stairs to enter Secretary/administrator of Steps: 2 Entrance Stairs-Rails: None Home Layout: Multi-level Alternate Level Stairs-Number of Steps: 2 Alternate Level Stairs-Rails: None Home Equipment: None Prior Function Level of Independence: Independent Comments: works as Civil engineer, 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 Dominant Hand: Right         Vision/Perception Vision - History Baseline Vision: Wears glasses all the time Patient Visual Report: No change from baseline Perception Perception: Within Functional Limits   Cognition  Cognition Arousal/Alertness: Awake/alert Behavior During Therapy: WFL for tasks assessed/performed Overall Cognitive Status: Within Functional Limits for tasks assessed    Extremity/Trunk Assessment Upper Extremity Assessment Upper Extremity Assessment: Overall WFL for tasks assessed Lower Extremity Assessment Lower Extremity Assessment: Defer to PT evaluation Cervical / Trunk Assessment Cervical / Trunk Assessment: Normal     Mobility Bed Mobility Bed Mobility: Not assessed Supine to Sit: 6: Modified independent (Device/Increase time);HOB flat Sitting - Scoot to Edge of Bed: 6: Modified  independent (Device/Increase time) Details for Bed Mobility Assistance: cues to avoid attempts to rotate neck in collar and good body mechanics Transfers Transfers: Sit to Stand;Stand to Sit Sit to  Stand: From chair/3-in-1;From toilet;Other (comment);6: Modified independent (Device/Increase time) Stand to Sit: 7: Independent;To chair/3-in-1;To toilet;Other (comment) (tub bench) Details for Transfer Assistance: cues for safest technique but no physical assist needed     Exercise     Balance Balance Balance Assessed: Yes Dynamic Sitting Balance Dynamic Sitting - Balance Support: No upper extremity supported;During functional activity;Feet unsupported Dynamic Sitting - Level of Assistance: 7: Independent Dynamic Standing Balance Dynamic Standing - Balance Support: No upper extremity supported;During functional activity Dynamic Standing - Level of Assistance: 6: Modified independent (Device/Increase time)   End of Session OT - End of Session Equipment Utilized During Treatment: Other (comment) (ADL A/E, tub bench) Activity Tolerance: Patient tolerated treatment well Patient left: in chair;with call bell/phone within reach;with family/visitor present  GO     Galen Manila 03/19/2013, 12:29 PM

## 2013-03-19 NOTE — Progress Notes (Signed)
Verbalizes understanding of D/C instructions. Sent out via W/C in stable condition with [employer].

## 2013-03-19 NOTE — Progress Notes (Signed)
Response to CDI query: patient has well controlled DM.

## 2013-03-19 NOTE — Evaluation (Signed)
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 Presents to PT at [facility] I level with mobility and gait.  Education completed and pt. Will have 24 hour assist at home.  She does not have PT follow up needs and does not need PT equipment, though she would like to have a tub bench (OT following up on this matter).  Pt. Has Dc home orders and PT will sign off.    PT Assessment  Patent does not need any further PT services    Follow Up Recommendations  No PT follow up    Does the patient have the potential to tolerate intense rehabilitation      Barriers to Discharge        Equipment Recommendations  None recommended by PT    Recommendations for Other Services     Frequency      Precautions / Restrictions Precautions Precautions: Cervical Precaution Comments: Pt provided with education and handout from PT Required Braces or Orthoses: Cervical Brace Cervical Brace: Hard collar Restrictions Weight Bearing Restrictions: No   Pertinent Vitals/Pain See vitals tab       Mobility  Bed Mobility Bed Mobility: Not assessed Supine to Sit: 6: Modified independent (Device/Increase time);HOB flat Sitting - Scoot to Edge of Bed: 6: Modified independent (Device/Increase time) Details for Bed Mobility Assistance: cues to avoid attempts to rotate neck in collar and good body mechanics Transfers Transfers: Sit to Stand;Stand to Sit Sit to Stand: From chair/3-in-1;From toilet;Other (comment);6: Modified independent (Device/Increase time) Stand to Sit: 7: Independent;To chair/3-in-1;To toilet;Other (comment) (tub bench) Details for Transfer Assistance: cues for safest technique but no  physical assist needed Ambulation/Gait Ambulation/Gait Assistance: 6: Modified independent (Device/Increase time) Ambulation Distance (Feet): 200 Feet Assistive device: None Ambulation/Gait Assistance Details: pt. ambulating at mod I level with no overt LOB noted Gait Pattern: Within Functional Limits Stairs: Yes Stairs Assistance: 6: Modified independent (Device/Increase time);4: Min guard;Other (comment) (min guard when going down steps due to cervical collar) Stair Management Technique: No rails;Step to pattern;Forwards Number of Stairs: 5    Exercises     PT Diagnosis:    PT Problem List:   PT Treatment Interventions:       PT Goals(Current goals can be found in the care plan section) Acute Rehab PT Goals Patient Stated Goal: to return home  Visit Information  Last PT Received On: 03/19/13 Assistance Needed: +1 History of Present Illness: Pt with Cervical Spondylotic Myelopathy, Anterior cervical diskectomy and fusion, 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       Prior Functioning  Home Living Family/patient expects to be discharged to:: Private residence Living Arrangements: Spouse/significant other Available Help at Discharge: Available 24 hours/day Type of Home: House Home Access: Stairs to enter Secretary/administrator of Steps: 2 Entrance Stairs-Rails: None Home Layout: Multi-level Alternate Level Stairs-Number of Steps: 2 Alternate Level Stairs-Rails: None Home Equipment: None Prior Function Level of Independence: Independent Comments: works as Civil engineer, 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 Dominant Hand: Right    Cognition  Cognition Arousal/Alertness: Awake/alert Behavior During Therapy: WFL for tasks assessed/performed Overall Cognitive Status: Within Functional Limits for tasks assessed    Extremity/Trunk Assessment Upper Extremity Assessment Upper Extremity Assessment: Overall WFL for tasks assessed Lower Extremity Assessment Lower Extremity Assessment: Defer  to  PT evaluation Cervical / Trunk Assessment Cervical / Trunk Assessment: Normal   Balance Balance Balance Assessed: Yes Dynamic Sitting Balance Dynamic Sitting - Balance Support: No upper extremity supported;During functional activity;Feet unsupported Dynamic Sitting - Level of Assistance: 7: Independent Dynamic Standing Balance Dynamic Standing - Balance Support: No upper extremity supported;During functional activity Dynamic Standing - Level of Assistance: 6: Modified independent (Device/Increase time)  End of Session PT - End of Session Equipment Utilized During Treatment: Cervical collar Activity Tolerance: Patient tolerated treatment well Patient left: Other (comment) (in gym seated on mat table with OT) Nurse Communication: Mobility status  GP     Ferman Hamming 03/19/2013, 12:28 PM Weldon Picking PT Acute Rehab Services [phone] Bee[place] [phone]

## 2013-03-19 NOTE — Progress Notes (Signed)
03/19/13 OT recommended a tub bench. Spoke with patient, she plans to get one outside of the hospital due to insurance does not cover them. No other discharge needs identified. Jacquelynn Cree RN, BSN, CCM

## 2013-03-19 NOTE — Clinical Documentation Improvement (Signed)
THIS DOCUMENT IS NOT A PERMANENT PART OF THE MEDICAL RECORD  Please update your documentation with the medical record to reflect your response to this query. If you need help knowing how to do this please call [phone].  03/19/13   Dear Dr. Vergia Alberts,  In a better effort to capture your patient's severity of illness, reflect appropriate length of stay and utilization of resources, a review of the patient medical record has revealed the following indicators.    Based on your clinical judgment, please clarify and document in a progress note and/or discharge summary the clinical condition associated with the following supporting information:  In responding to this query please exercise your independent judgment.  The fact that a query is asked, does not imply that any particular answer is desired or expected.  Please clarify and specify Diabetes type, control, manifestations, and associated conditions.  Possible Clinical Conditions?   "   Diabetes Type  1 or 2 "   Controlled or uncontrolled  "   Other Condition  "   Cannot Clinically determine     Risk Factors: Diabetes Mellitus noted Anesthesia notes 03/18/13.  Lab: 11/20:  Hgb A1c level:  6.7 Mean plasma glucose:  146 Glucose, capillary:  277   You may use possible, probable, or suspect with inpatient documentation. possible, probable, suspected diagnoses MUST be documented at the time of discharge  Reviewed:  no additional documentation provided  Thank You,  Marciano Sequin, Clinical Documentation Specialist: [phone] Health Information Management [place]  See progress note for additional information

## 2013-03-23 ENCOUNTER — Encounter (HOSPITAL_COMMUNITY): Payer: Self-pay | Admitting: Orthopedic Surgery

## 2013-04-15 NOTE — Discharge Summary (Signed)
Agree with above 

## 2013-04-15 NOTE — Discharge Summary (Signed)
  ABBREVIATED DISCHARGE SUMMARY      DATE OF HOSPITALIZATION: 18 Mar 2013  REASON FOR HOSPITALIZATION:  67 yo female with hx of C4-7 spondylosis, neck pain.  Failed conservative treatment.      SIGNIFICANT FINDINGS:  Cervical spondylosis, ddd  OPERATION:  C4-7 ACDF  FINAL DIAGNOSIS:  same  SECONDARY DIAGNOSIS: none  CONSULTANTS:  none  DISCHARGE CONDITION:  STABLE  DISCHARGED TO:  HOME

## 2014-12-12 ENCOUNTER — Other Ambulatory Visit: Payer: Self-pay

## 2014-12-12 DIAGNOSIS — Z1231 Encounter for screening mammogram for malignant neoplasm of breast: Secondary | ICD-10-CM

## 2015-02-20 ENCOUNTER — Ambulatory Visit
Admission: RE | Admit: 2015-02-20 | Discharge: 2015-02-20 | Disposition: A | Discharge status: Home / Self Care | Payer: Medicare Other | Source: Ambulatory Visit

## 2015-02-20 DIAGNOSIS — Z1231 Encounter for screening mammogram for malignant neoplasm of breast: Secondary | ICD-10-CM

## 2015-04-24 IMAGING — CR DG CERVICAL SPINE 2 OR 3 VIEWS
3 series · 3 of 3 positions shown · non-contrast
Comparison: None.

CLINICAL DATA: Neck pain.

EXAM:
CERVICAL SPINE - 2-3 VIEW

[w cervical spine lat]
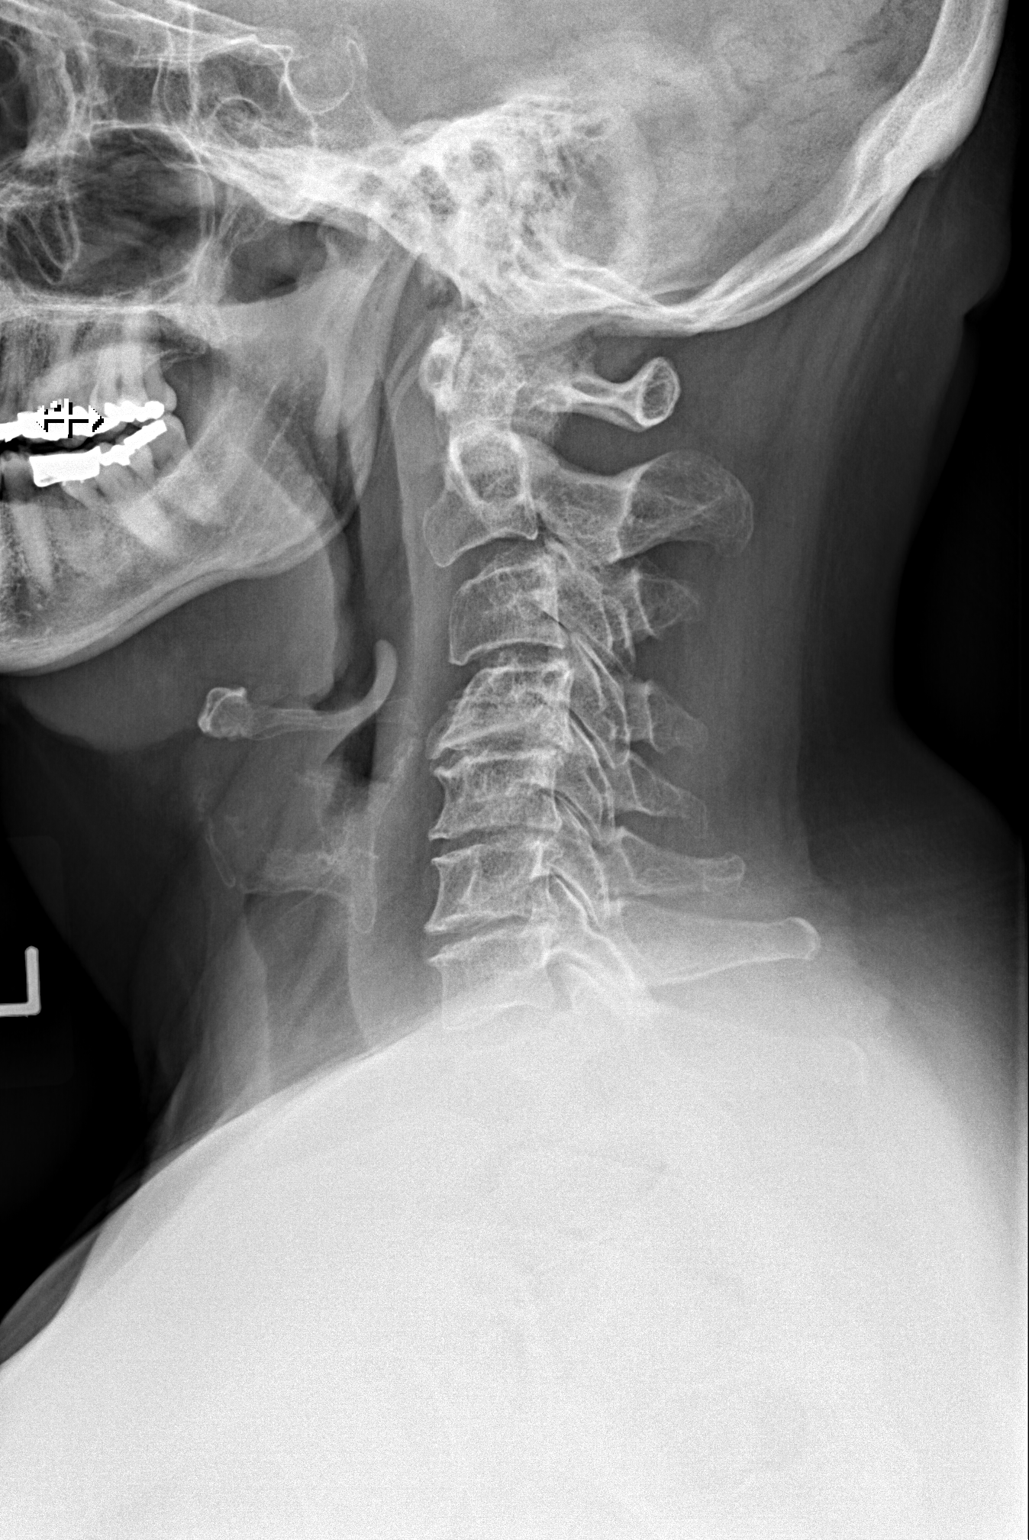

[w cervical swimmers]
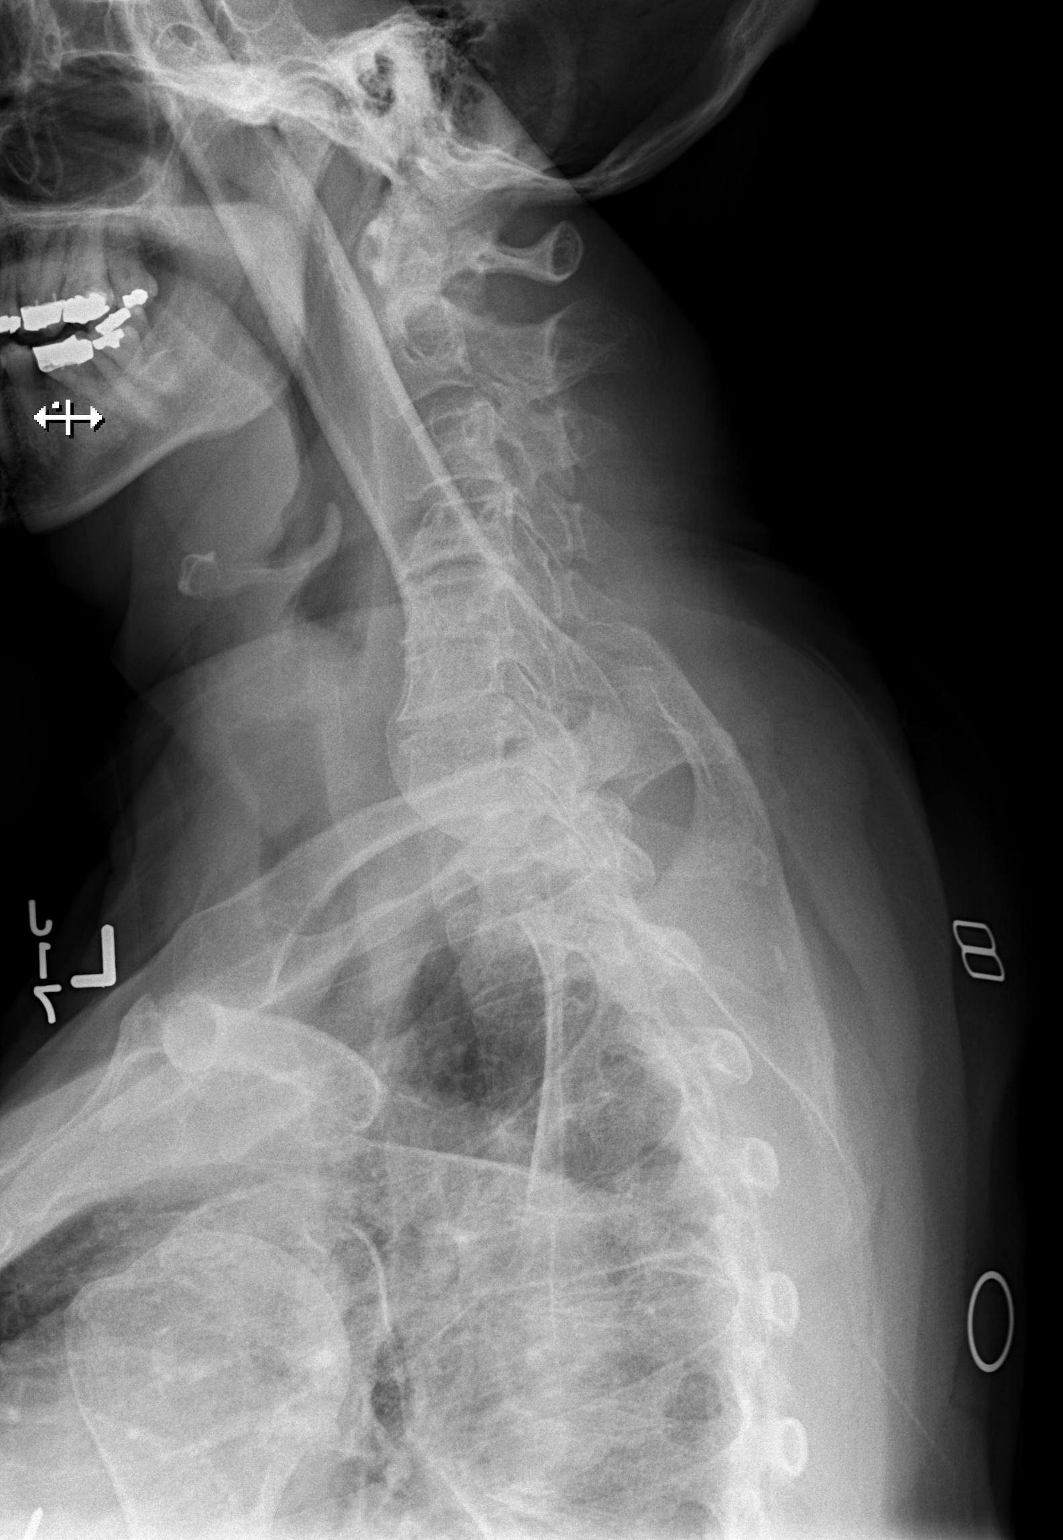

[w cervical spine ap]
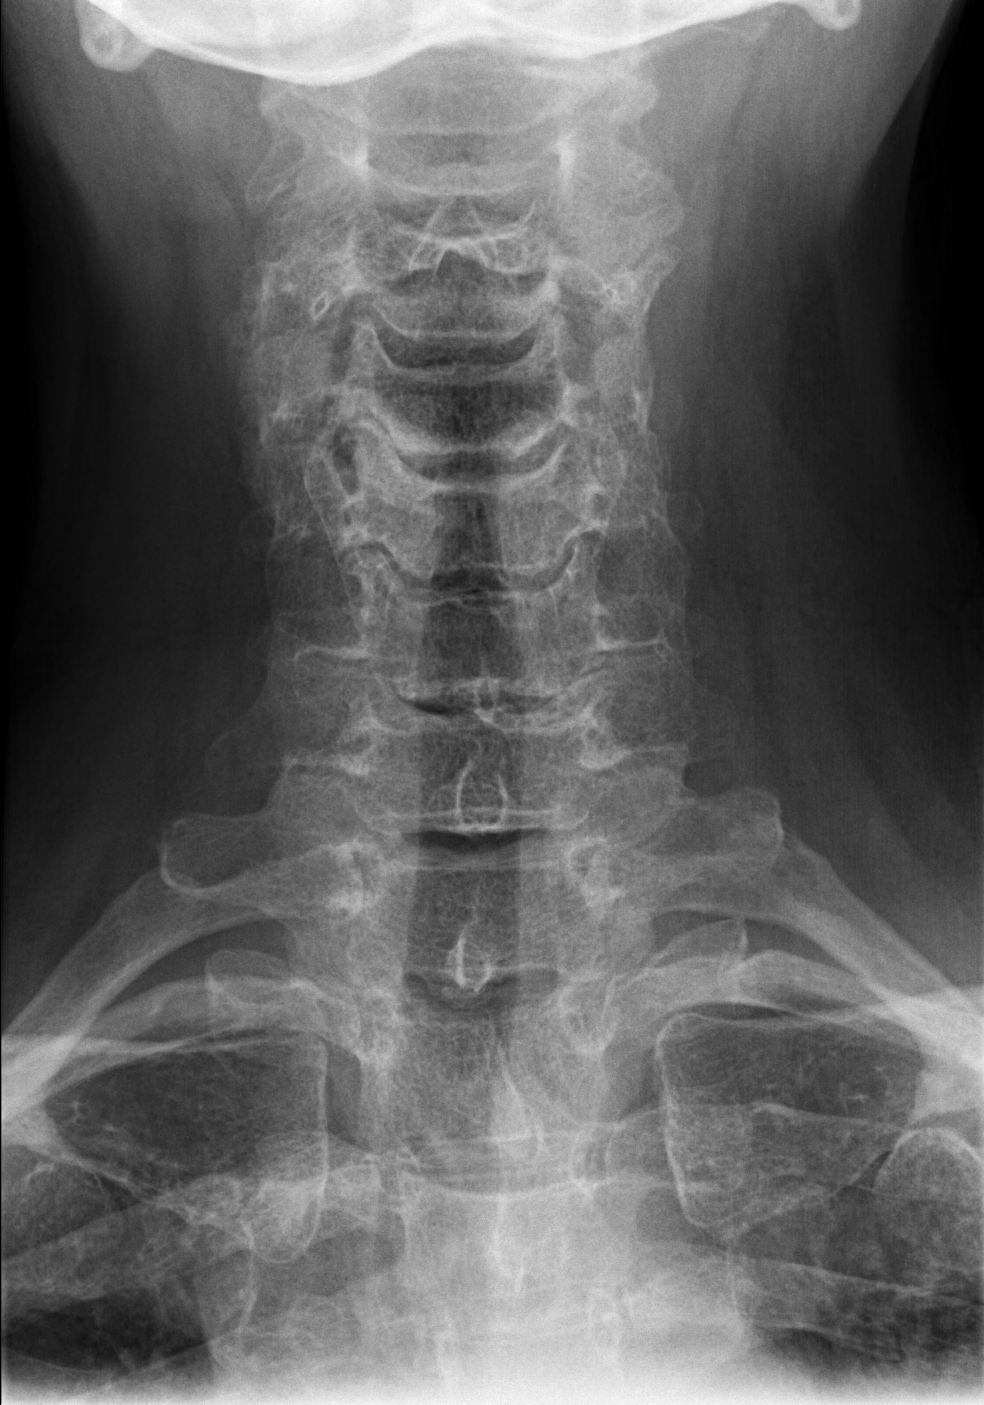

[3 of 3 positions shown; findings below may reference images not displayed]

FINDINGS: No fracture. No spondylolisthesis. There is a reversal of the normal
cervical lordosis with the apex at C4. Mild loss of disk height at
C3-C4 with moderate loss of disk height at C4-C5, C5-C6 and C6-C7.
There are endplate osteophytes at these levels.

Bones are demineralized.  Soft tissues are unremarkable.
IMPRESSION: No fracture or acute finding.  Degenerative changes as detailed.

## 2017-12-24 ENCOUNTER — Other Ambulatory Visit: Payer: Self-pay

## 2017-12-24 DIAGNOSIS — G5622 Lesion of ulnar nerve, left upper limb: Secondary | ICD-10-CM

## 2018-02-04 ENCOUNTER — Encounter: Payer: Self-pay | Admitting: Neurology

## 2018-02-04 ENCOUNTER — Encounter

## 2018-02-04 ENCOUNTER — Other Ambulatory Visit: Payer: Self-pay

## 2018-02-04 ENCOUNTER — Ambulatory Visit (INDEPENDENT_AMBULATORY_CARE_PROVIDER_SITE_OTHER): Payer: Medicare Other | Admitting: Neurology

## 2018-02-04 DIAGNOSIS — G959 Disease of spinal cord, unspecified: Secondary | ICD-10-CM

## 2018-02-04 HISTORY — DX: Disease of spinal cord, unspecified: G95.9

## 2018-02-04 MED ORDER — PREGABALIN 25 MG PO CAPS: 25.0000 mg | ORAL_CAPSULE | Freq: Two times a day (BID) | ORAL | 3 refills | Status: DC

## 2018-02-04 NOTE — Progress Notes (Signed)
Reason for visit: Left arm and shoulder pain  Referring physician: Dr. Georgeanna Lea is a 72 y.o. female  History of present illness:  Ms. Tappan is a 72 year old right-handed white female with a history of diabetes.  The patient had cervical spine surgery around 2015 at the C4-C7 level, the patient had spinal cord compression with high cord signal prior to surgery.  Before surgery, the patient reported some burning sensations in the left hand and forearm that persisted following surgery.  The patient spontaneously began having new discomfort in February 2019.  The patient has had achy sensations in the left axillary region with spread into the left shoulder blade and anterior chest.  The pain may become extremely severe, and may be activated by overusing the left arm.  During severe episodes of pain which may last several weeks, the patient cannot dangle the left arm without increased pain, she has to keep the arm flexed and slightly elevated.  The patient denies any true weakness of the extremities, she denies any pain on the right arm or with the legs.  She reports no numbness on the right arm or in the legs or feet.  The patient denies any headache or any particular neck pain.  She denies any balance changes or difficulty controlling the bowels or the bladder, she does have some urinary frequency.  The patient has undergone a work-up that has included a cervical spine MRI that shows an osteophyte complex with some impingement on the spinal cord at the C3-4 level above the level of prior surgery.  The patient has had MRI of the left shoulder as well that the patient claims was unremarkable.  The patient has had no change in the signal within the spinal cord at the site of the prior surgery.  The patient is sent to this office for an evaluation.  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  Apparently the  EMG of the left arm was normal.  No evidence of a brachial plexopathy was seen.  The patient is sent to this office for an evaluation.  In the past, the patient has not been able to tolerate gabapentin due to severe drowsiness, she was given a prescription for Lyrica but she never got the prescription filled.  Past Medical History:  Diagnosis Date  . Anemia   . Arthritis   . Diabetes mellitus without complication ([place])   . Factor V Leiden ([place])   . GERD (gastroesophageal reflux disease)    pt takes OTC meds  . Headache(784.0)   . Insomnia   . Numbness and tingling    in L arm from neck issues  . Overactive bladder    takes Vesicare  . PONV (postoperative nausea and vomiting)   . Sleep apnea    sleep study done > 12yrs ago;no cpap and since lost weight much improvement  . UTI (lower urinary tract infection)    currently taking Cipro  . Visual floaters     Past Surgical History:  Procedure Laterality Date  . ABDOMINAL HYSTERECTOMY    . ANTERIOR CERVICAL DECOMP/DISCECTOMY FUSION N/A 03/18/2013   Procedure: ACDF C4 - C7 3 LEVELS;  Surgeon: Melina Schools, MD;  Location: [facility];  Service: Orthopedics;  Laterality: N/A;  . COLONOSCOPY    . DILATION AND CURETTAGE OF UTERUS    . TONSILLECTOMY     adenoidectomy    Family  History  Problem Relation Age of Onset  . Heart attack Mother   . Hypertension Mother   . Diabetes type II Mother   . High Cholesterol Mother   . Diabetes type II Father   . Lung cancer Father   . Hypertension Sister   . COPD Brother   . Drug abuse Brother   . Drug abuse Brother   . Stomach cancer Brother   . Vascular Disease Sister   . Diabetes type II Sister     Social history:  reports that she has never smoked. She has never used smokeless tobacco. She reports that she drinks about 1.0 - 2.0 standard drinks of alcohol per week. She reports that she does not use drugs.  Medications:  Prior to Admission medications   Medication Sig Start Date End Date  Taking? Authorizing Provider  BIOTIN PO Take by mouth.   Yes [provider]  calcium carbonate (OSCAL) 1500 (600 Ca) MG TABS tablet Take by mouth 2 (two) times daily with a meal.   Yes [provider]  cholecalciferol (VITAMIN D) 1000 units tablet Take 6,000 Units by mouth daily.   Yes [provider]  cyanocobalamin ([employer] VITAMIN B12) 1000 MCG tablet Take by mouth.   Yes [provider]  metFORMIN (GLUCOPHAGE-XR) 500 MG 24 hr tablet Take 1,000 mg by mouth 2 (two) times daily.    Yes [provider]  ondansetron (ZOFRAN ODT) 4 MG disintegrating tablet Take 1 tablet (4 mg total) by mouth every 8 (eight) hours as needed for nausea or vomiting. 03/19/13  Yes Lanae Crumbly, PA-C  ramipril (ALTACE) 2.5 MG capsule Take 2.5 mg by mouth daily.   Yes [provider]  zolpidem (AMBIEN) 10 MG tablet Take 5 mg by mouth at bedtime as needed for sleep.   Yes [provider]      Allergies  Allergen Reactions  . Codeine Nausea And Vomiting  . Keflex [Cephalexin] Other (See Comments)    Per patient, may have caused itching or swelling  . Macrobid [Nitrofurantoin Macrocrystal] Other (See Comments)    Per patient, may have caused itching or swelling    ROS:  Out of a complete 14 system review of symptoms, the patient complains only of the following symptoms, and all other reviewed systems are negative.  Fatigue Hearing loss Birthmarks, moles Anemia Feeling hot, increased thirst Not enough sleep, decreased energy, racing thoughts  Blood pressure (!) 149/87, pulse 66, resp. rate 18, height 5\' 2"  (1.575 m), weight 168 lb (76.2 kg).  Physical Exam  General: The patient is alert and cooperative at the time of the examination.  The patient is moderately obese.  Eyes: Pupils are equal, round, and reactive to light. Discs are flat bilaterally.  Neck: The neck is supple, no carotid bruits are noted.  Respiratory: The respiratory  examination is clear.  Cardiovascular: The cardiovascular examination reveals a regular rate and rhythm, no obvious murmurs or rubs are noted.  Neuromuscular: Range move the cervical spine is relatively full.  Passive range of movement of the left shoulder is full without discomfort.  Some atrophy of the left first dorsal interosseous muscle is seen.  Skin: Extremities are without significant edema.  Neurologic Exam  Mental status: The patient is alert and oriented x 3 at the time of the examination. The patient has apparent normal recent and remote memory, with an apparently normal attention span and concentration ability.  Cranial nerves: Facial symmetry is present. There is good  sensation of the face to pinprick and soft touch bilaterally. The strength of the facial muscles and the muscles to head turning and shoulder shrug are normal bilaterally. Speech is well enunciated, no aphasia or dysarthria is noted. Extraocular movements are full. Visual fields are full. The tongue is midline, and the patient has symmetric elevation of the soft palate. No obvious hearing deficits are noted.  Motor: The motor testing reveals 5 over 5 strength of all 4 extremities, with exception of some slight weakness of the intrinsic muscles of the hands bilaterally, weakness with finger extension is seen on the left hand. Good symmetric motor tone is noted throughout.  Sensory: Sensory testing is intact to pinprick, soft touch, vibration sensation, and position sense on all 4 extremities. No evidence of extinction is noted.  Coordination: Cerebellar testing reveals good finger-nose-finger and heel-to-shin bilaterally.  Gait and station: Gait is normal. Tandem gait is normal. Romberg is negative. No drift is seen.  Reflexes: Deep tendon reflexes are symmetric, but are slightly depressed bilaterally. Toes are downgoing bilaterally.   Assessment/Plan:  1.  History of cervical myelopathy, status post  decompressive surgery  2.  New onset left shoulder and upper chest discomfort, intermittent  The patient likely has neuropathic pain involving the left shoulder, this still could be from the cervical spine.  The patient has left carpal tunnel syndrome which could refer some discomfort to the left elbow or shoulder, but the severity of the pain is atypical for this.  The MRI of the cervical spine report and disc were not available for my review, I will try to obtain this.  The patient is to restart the Lyrica taking 25 mg twice daily.  A prescription was given.  She will follow-up in 4 months.  Jill Alexanders MD 02/04/2018 10:53 AM  [facility] [address] [place], [place] [address]  Phone [phone] Fax [phone]

## 2018-02-04 NOTE — Patient Instructions (Signed)
We will start lyrica 25 mg twice a day.

## 2018-02-05 ENCOUNTER — Telehealth: Payer: Self-pay | Admitting: Neurology

## 2018-02-05 NOTE — Telephone Encounter (Signed)
MRI of the cervical spine does show some myelomalacia of the left hemicord, this could be a source of chronic pain.   MRI cervical 09/03/17:  1.  Congenitally narrowed spinal canal 2.  C4-C7 ACDF.  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  Accompanying cord atrophy, myelomalacia of the left hemicord at C4 and C5.  C5-6 moderate to severe right neural foraminal stenosis. 3.  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  Probable moderate left neuroforaminal stenosis. 4.  C7-T1 mild disc bulge resulting in mild central canal stenosis.   MRI of the left axilla was normal.

## 2018-02-06 ENCOUNTER — Telehealth: Payer: Self-pay | Admitting: Neurology

## 2018-02-06 NOTE — Telephone Encounter (Signed)
The MRI of the cervical spine disc was reviewed. There does not appear to be significant compression of the spinal cord at C3-4 level.

## 2018-02-10 ENCOUNTER — Ambulatory Visit (HOSPITAL_COMMUNITY)
Admission: RE | Admit: 2018-02-10 | Discharge: 2018-02-10 | Disposition: A | Discharge status: Home / Self Care | Payer: Medicare Other | Source: Ambulatory Visit | Attending: Vascular Surgery | Admitting: Vascular Surgery

## 2018-02-10 ENCOUNTER — Ambulatory Visit (INDEPENDENT_AMBULATORY_CARE_PROVIDER_SITE_OTHER): Payer: Medicare Other | Admitting: Vascular Surgery

## 2018-02-10 ENCOUNTER — Encounter: Payer: Self-pay | Admitting: Vascular Surgery

## 2018-02-10 VITALS — BP 128/70 | HR 66 | Temp 97.6°F | Resp 18 | Ht 62.0 in | Wt 171.5 lb

## 2018-02-10 DIAGNOSIS — M79602 Pain in left arm: Secondary | ICD-10-CM

## 2018-02-10 DIAGNOSIS — G5622 Lesion of ulnar nerve, left upper limb: Secondary | ICD-10-CM | POA: Diagnosis present

## 2018-02-10 NOTE — Progress Notes (Signed)
[facility] [place]  Patient name: Vickie Ward MRN: [id number] DOB: 01-18-1946 Sex: female  REASON FOR CONSULT: Evaluation pain left upper extremity to rule out thoracic outlet syndrome  HPI: Vickie Ward is a 72 y.o. female, who is here today for discussion of left arm pain.  She reports that this is been present since February of this year.  She feels that at times there is a sensation that her arm is being separated from her shoulder.  She said initially she had aching in her armpit and continues to have difficulty in her shoulder as well.  She reports that she was placed on steroids with some help initially and also has been diagnosed as carpal tunnel syndrome on the left as well.  She reports that she will have episodes of pain and then complete resolution for several weeks and then will have several weeks of discomfort again.  She gets relief and holding her arm with her elbow bent in her hand up by her shoulder.  Denies any pain with her arms over her head.  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  No history of coronary artery disease  Past Medical History:  Diagnosis Date  . Anemia   . Arthritis   . Cervical myelopathy ([place]) 02/04/2018  . Diabetes mellitus without complication ([place])   . Factor V Leiden ([place])   . GERD (gastroesophageal reflux disease)    pt takes OTC meds  . Headache(784.0)   . Insomnia   . Numbness and tingling    in L arm from neck issues  . Overactive bladder    takes Vesicare  . PONV (postoperative nausea and vomiting)   . Sleep apnea    sleep study done > 51yrs ago;no cpap and since lost weight much improvement  . UTI (lower urinary tract infection)    currently taking Cipro  . Visual floaters     Family History  Problem Relation Age of Onset  . Heart attack Mother   . Hypertension Mother   . Diabetes type II Mother   . High Cholesterol Mother   . Diabetes type II Father   . Lung  cancer Father   . Hypertension Sister   . COPD Brother   . Drug abuse Brother   . Drug abuse Brother   . Stomach cancer Brother   . Vascular Disease Sister   . Diabetes type II Sister     SOCIAL HISTORY: Social History   Socioeconomic History  . Marital status: Married    Spouse name: Not on file  . Number of children: Not on file  . Years of education: Not on file  . Highest education level: Not on file  Occupational History  . Occupation: Retired  Scientific laboratory technician  . Financial resource strain: Not on file  . Food insecurity:    Worry: Not on file    Inability: Not on file  . Transportation needs:    Medical: Not on file    Non-medical: Not on file  Tobacco Use  . Smoking status: Never Smoker  . Smokeless tobacco: Never Used  Substance and Sexual Activity  . Alcohol use: Yes    Alcohol/week: 1.0 - 2.0 standard drinks    Types: 1 - 2 Glasses of wine per week    Comment: daily  . Drug use: Never  . Sexual activity: Not on file  Lifestyle  . Physical activity:    Days per week: Not on file  Minutes per session: Not on file  . Stress: Not on file  Relationships  . Social connections:    Talks on phone: Not on file    Gets together: Not on file    Attends religious service: Not on file    Active member of club or organization: Not on file    Attends meetings of clubs or organizations: Not on file    Relationship status: Not on file  . Intimate partner violence:    Fear of current or ex partner: Not on file    Emotionally abused: Not on file    Physically abused: Not on file    Forced sexual activity: Not on file  Other Topics Concern  . Not on file  Social History Narrative  . Not on file    Allergies  Allergen Reactions  . Codeine Nausea And Vomiting  . Keflex [Cephalexin] Other (See Comments)    Per patient, may have caused itching or swelling  . Macrobid [Nitrofurantoin Macrocrystal] Other (See Comments)    Per patient, may have caused itching or  swelling    Current Outpatient Medications  Medication Sig Dispense Refill  . BIOTIN PO Take by mouth.    . calcium carbonate (OSCAL) 1500 (600 Ca) MG TABS tablet Take by mouth 2 (two) times daily with a meal.    . cholecalciferol (VITAMIN D) 1000 units tablet Take 6,000 Units by mouth daily.    . cyanocobalamin ([facility] VITAMIN B12) 1000 MCG tablet Take by mouth.    . metFORMIN (GLUCOPHAGE-XR) 500 MG 24 hr tablet Take 1,000 mg by mouth 2 (two) times daily.     . ondansetron (ZOFRAN ODT) 4 MG disintegrating tablet Take 1 tablet (4 mg total) by mouth every 8 (eight) hours as needed for nausea or vomiting. 60 tablet 0  . pregabalin (LYRICA) 25 MG capsule Take 1 capsule (25 mg total) by mouth 2 (two) times daily. 60 capsule 3  . ramipril (ALTACE) 2.5 MG capsule Take 2.5 mg by mouth daily.    Marland Kitchen zolpidem (AMBIEN) 10 MG tablet Take 5 mg by mouth at bedtime as needed for sleep.     No current facility-administered medications for this visit.     REVIEW OF SYSTEMS:  [X]  denotes positive finding, [ ]  denotes negative finding Cardiac  Comments:  Chest pain or chest pressure:    Shortness of breath upon exertion:    Short of breath when lying flat:    Irregular heart rhythm: x       Vascular    Pain in calf, thigh, or hip brought on by ambulation:    Pain in feet at night that wakes you up from your sleep:     Blood clot in your veins:    Leg swelling:         Pulmonary    Oxygen at home:    Productive cough:     Wheezing:         Neurologic    Sudden weakness in arms or legs:     Sudden numbness in arms or legs:     Sudden onset of difficulty speaking or slurred speech:    Temporary loss of vision in one eye:     Problems with dizziness:         Gastrointestinal    Blood in stool:     Vomited blood:         Genitourinary    Burning when urinating:     Blood in  urine:        Psychiatric    Major depression:         Hematologic    Bleeding problems:    Problems with blood  clotting too easily: x  factor V Leiden deficiency      Skin    Rashes or ulcers:        Constitutional    Fever or chills:      PHYSICAL EXAM: Vitals:   02/10/18 1312  BP: 128/70  Pulse: 66  Resp: 18  Temp: 97.6 F (36.4 C)  TempSrc: Oral  SpO2: 99%  Weight: 171 lb 8 oz (77.8 kg)  Height: 5\' 2"  (1.575 m)    GENERAL: The patient is a well-nourished female, in no acute distress. The vital signs are documented above. CARDIOVASCULAR: 2+ dorsalis pedis pulses bilaterally, 2+ radial pulses bilaterally.  No change in pulse status with arms elevated and rotated posteriorly PULMONARY: There is good air exchange  ABDOMEN: Soft and non-tender  MUSCULOSKELETAL: There are no major deformities or cyanosis. NEUROLOGIC: No focal weakness or paresthesias are detected. SKIN: There are no ulcers or rashes noted. PSYCHIATRIC: The patient has a normal affect.  DATA:  She did undergo thoracic outlet maneuvering in our noninvasive laboratory today.  This showed no evidence of arterial compression in her right or left arm with maneuvering.  MEDICAL ISSUES: I discussed these findings with patient.  I do not see any evidence of arterial deficiency or neurogenic thoracic outlet syndrome to explain her arm pain.  She was relieved with this discussion will see [place] again on an as-needed basis   Rosetta Posner, MD [facility] Tel [phone] Pager [phone]

## 2018-06-02 ENCOUNTER — Telehealth: Payer: Self-pay | Admitting: Neurology

## 2018-06-02 ENCOUNTER — Ambulatory Visit: Payer: Medicare Other | Admitting: Neurology

## 2018-06-02 NOTE — Telephone Encounter (Signed)
This patient did not show for a revisit appointment today. 

## 2018-06-03 ENCOUNTER — Encounter: Payer: Self-pay | Admitting: Neurology

## 2019-04-06 ENCOUNTER — Other Ambulatory Visit (HOSPITAL_COMMUNITY): Payer: Self-pay | Admitting: Orthopedic Surgery

## 2019-04-06 DIAGNOSIS — M7989 Other specified soft tissue disorders: Secondary | ICD-10-CM

## 2019-04-06 DIAGNOSIS — M79604 Pain in right leg: Secondary | ICD-10-CM

## 2019-04-07 ENCOUNTER — Ambulatory Visit (HOSPITAL_COMMUNITY)
Admission: RE | Admit: 2019-04-07 | Discharge: 2019-04-07 | Disposition: A | Discharge status: Home / Self Care | Payer: Medicare Other | Source: Ambulatory Visit | Attending: Orthopedic Surgery | Admitting: Orthopedic Surgery

## 2019-04-07 ENCOUNTER — Other Ambulatory Visit: Payer: Self-pay

## 2019-04-07 ENCOUNTER — Encounter (INDEPENDENT_AMBULATORY_CARE_PROVIDER_SITE_OTHER): Payer: Self-pay

## 2019-04-07 DIAGNOSIS — M7989 Other specified soft tissue disorders: Secondary | ICD-10-CM | POA: Diagnosis present

## 2019-04-07 DIAGNOSIS — M79604 Pain in right leg: Secondary | ICD-10-CM | POA: Diagnosis not present

## 2019-04-07 NOTE — Progress Notes (Signed)
Lower extremity venous has been completed.   Preliminary results in CV Proc.   Abram Sander 04/07/2019 8:57 AM

## 2019-06-07 ENCOUNTER — Other Ambulatory Visit (HOSPITAL_COMMUNITY): Payer: Self-pay | Admitting: General Surgery

## 2019-06-07 ENCOUNTER — Other Ambulatory Visit: Payer: Self-pay | Admitting: General Surgery

## 2019-06-07 ENCOUNTER — Ambulatory Visit (HOSPITAL_COMMUNITY)
Admission: RE | Admit: 2019-06-07 | Discharge: 2019-06-07 | Disposition: A | Discharge status: Home / Self Care | Payer: Medicare Other | Source: Ambulatory Visit | Attending: General Surgery | Admitting: General Surgery

## 2019-06-07 ENCOUNTER — Other Ambulatory Visit: Payer: Self-pay

## 2019-06-07 DIAGNOSIS — D171 Benign lipomatous neoplasm of skin and subcutaneous tissue of trunk: Secondary | ICD-10-CM | POA: Diagnosis present

## 2021-06-12 LAB — HEMOGLOBIN A1C: Hemoglobin A1C: 7.4

## 2021-06-12 LAB — BASIC METABOLIC PANEL: Glucose: 125

## 2021-06-12 LAB — COMPREHENSIVE METABOLIC PANEL: eGFR: 92

## 2021-07-17 ENCOUNTER — Encounter: Payer: Self-pay | Admitting: Nurse Practitioner

## 2021-07-17 ENCOUNTER — Other Ambulatory Visit: Payer: Self-pay

## 2021-07-17 ENCOUNTER — Ambulatory Visit (INDEPENDENT_AMBULATORY_CARE_PROVIDER_SITE_OTHER): Payer: Medicare Other | Admitting: Nurse Practitioner

## 2021-07-17 VITALS — BP 125/66 | HR 63 | Ht 62.0 in | Wt 165.8 lb

## 2021-07-17 DIAGNOSIS — R5382 Chronic fatigue, unspecified: Secondary | ICD-10-CM | POA: Diagnosis not present

## 2021-07-17 DIAGNOSIS — I10 Essential (primary) hypertension: Secondary | ICD-10-CM

## 2021-07-17 DIAGNOSIS — E119 Type 2 diabetes mellitus without complications: Secondary | ICD-10-CM | POA: Diagnosis not present

## 2021-07-17 DIAGNOSIS — E782 Mixed hyperlipidemia: Secondary | ICD-10-CM | POA: Diagnosis not present

## 2021-07-17 NOTE — Patient Instructions (Signed)

## 2021-07-17 NOTE — Progress Notes (Signed)
? ?                                                    Endocrinology Consult Note  ?     07/17/2021, 12:52 PM ? ? ?Subjective:  ? ? Patient ID: Vickie Ward, female    DOB: Apr 18, 1946.  ?Vickie Ward is being seen in consultation for management of currently uncontrolled symptomatic diabetes requested by  Ephriam Jenkins E. ? ? ?Past Medical History:  ?Diagnosis Date  ? Anemia   ? Arthritis   ? Cervical myelopathy ([place]) 02/04/2018  ? Diabetes mellitus without complication ([place])   ? Factor V Leiden ([place])   ? GERD (gastroesophageal reflux disease)   ? pt takes OTC meds  ? Headache(784.0)   ? Insomnia   ? Numbness and tingling   ? in L arm from neck issues  ? Osteoporosis   ? Overactive bladder   ? takes Vesicare  ? PONV (postoperative nausea and vomiting)   ? Sleep apnea   ? sleep study done > 83yr ago;no cpap and since lost weight much improvement  ? UTI (lower urinary tract infection)   ? currently taking Cipro  ? Visual floaters   ? ? ?Past Surgical History:  ?Procedure Laterality Date  ? ABDOMINAL HYSTERECTOMY    ? ANTERIOR CERVICAL DECOMP/DISCECTOMY FUSION N/A 03/18/2013  ? Procedure: ACDF C4 - C7 3 LEVELS;  Surgeon: DMelina Schools MD;  [facility]  Service: Orthopedics;  Laterality: N/A;  ? COLONOSCOPY    ? DILATION AND CURETTAGE OF UTERUS    ? REPLACEMENT TOTAL KNEE    ? 2019  ? TONSILLECTOMY    ? adenoidectomy  ? ? ?Social History  ? ?Socioeconomic History  ? Marital status: Married  ?  Spouse name: Not on file  ? Number of children: Not on file  ? Years of education: Not on file  ? Highest education level: Not on file  ?Occupational History  ? Occupation: Retired  ?Tobacco Use  ? Smoking status: Never  ? Smokeless tobacco: Never  ?Vaping Use  ? Vaping Use: Never used  ?Substance and Sexual Activity  ? Alcohol use: Yes  ?  Alcohol/week: 1.0 - 2.0 standard drink  ?  Types: 1 - 2 Glasses of wine per week  ?  Comment: daily  ? Drug use: Never  ? Sexual activity: Not on file  ?Other  Topics Concern  ? Not on file  ?Social History Narrative  ? Not on file  ? ?Social Determinants of Health  ? ?Financial Resource Strain: Not on file  ?Food Insecurity: Not on file  ?Transportation Needs: Not on file  ?Physical Activity: Not on file  ?Stress: Not on file  ?Social Connections: Not on file  ? ? ?Family History  ?Problem Relation Age of Onset  ? Heart attack Mother   ? Hypertension Mother   ? Diabetes type II Mother   ? High Cholesterol Mother   ? Hyperlipidemia Mother   ? Heart failure Mother   ? Diabetes type II Father   ? Lung cancer Father   ? Hypertension Sister   ? Vascular Disease Sister   ? Diabetes type II Sister   ? COPD Brother   ? Drug abuse Brother   ? Drug abuse Brother   ? Stomach cancer Brother   ? ? ?  Outpatient Encounter Medications as of 07/17/2021  ?Medication Sig  ? ALLERGY RELIEF D-12 5-120 MG tablet SMARTSIG:1 Tablet(s) By Mouth Every 12 Hours  ? amoxicillin (AMOXIL) 500 MG capsule SMARTSIG:4 Capsule(s) By Mouth  ? cholecalciferol (VITAMIN D) 1000 units tablet Take 6,000 Units by mouth daily.  ? cyanocobalamin (,VITAMIN B-12,) 1000 MCG/ML injection Inject 1,000 mcg into the muscle every 30 (thirty) days.  ? cyanocobalamin 1000 MCG tablet Take by mouth.  ? estradiol (ESTRACE) 0.1 MG/GM vaginal cream estradiol 0.01% (0.1 mg/gram) vaginal cream ? apply a pea sized AMOUNT VAGINALLY (bladder opening at the vagina) AS DIRECTED NIGHTLY FOR SIX WEEKS, THEN TWICE A WEEK FOR maintenance  ? Melatonin-Pyridoxine 10-10 MG TBCR Take by mouth.  ? metFORMIN (GLUCOPHAGE-XR) 500 MG 24 hr tablet Take 500 mg by mouth daily.  ? nitroGLYCERIN (NITROSTAT) 0.4 MG SL tablet nitroglycerin 0.4 mg sublingual tablet ? TABLET 0.'4MG'$  UNDER THE TONGUE EVERY FIVE MINUTES AS NEEDED FOR CHEST PAIN. DO NOT EXCEED THREE DOSES IN 15 MINUTES  ? Omega-3 1000 MG CAPS Take by mouth.  ? ramipril (ALTACE) 2.5 MG capsule Take 2.5 mg by mouth daily.  ? zolpidem (AMBIEN) 10 MG tablet Take 5 mg by mouth at bedtime as needed for  sleep.  ? [DISCONTINUED] BIOTIN PO Take by mouth. (Patient not taking: Reported on 07/17/2021)  ? [DISCONTINUED] calcium carbonate (OSCAL) 1500 (600 Ca) MG TABS tablet Take by mouth 2 (two) times daily with a meal. (Patient not taking: Reported on 07/17/2021)  ? [DISCONTINUED] ondansetron (ZOFRAN ODT) 4 MG disintegrating tablet Take 1 tablet (4 mg total) by mouth every 8 (eight) hours as needed for nausea or vomiting. (Patient not taking: Reported on 07/17/2021)  ? [DISCONTINUED] pregabalin (LYRICA) 25 MG capsule Take 1 capsule (25 mg total) by mouth 2 (two) times daily. (Patient not taking: Reported on 07/17/2021)  ? ?No facility-administered encounter medications on file as of 07/17/2021.  ? ? ?ALLERGIES: ?Allergies  ?Allergen Reactions  ? Cephalexin Other (See Comments) and Rash  ?  Per patient, may have caused itching or swelling ?Other reaction(s): Other (See Comments) ?Pt. States of the face ?Other Reaction: Other reaction ?Per patient, may have caused itching or swelling ?  ? Codeine Nausea And Vomiting  ? Nitrofurantoin Rash  ?  Other reaction(s): Other (See Comments) ?Mouth Ulcers ?  ? Macrobid [Nitrofurantoin Macrocrystal] Other (See Comments)  ?  Per patient, may have caused itching or swelling  ? Sulfamethoxazole-Trimethoprim   ?  Other reaction(s): Other (See Comments) ?Oral thrush  ? ? ?VACCINATION STATUS: ? ?There is no immunization history on file for this patient. ? ?Diabetes ?She presents for her initial diabetic visit. She has type 2 diabetes mellitus. Onset time: Diagnosed at approx age of 47. Her disease course has been fluctuating. Hypoglycemia symptoms include sleepiness. (weakness) Associated symptoms include fatigue and weakness. There are no hypoglycemic complications. Symptoms are stable. There are no diabetic complications. Risk factors for coronary artery disease include diabetes mellitus, dyslipidemia, family history, hypertension, stress, sedentary lifestyle and post-menopausal. Current  diabetic treatment includes oral agent (monotherapy). She is compliant with treatment most of the time. Her weight is fluctuating minimally. She is following a generally healthy diet. When asked about meal planning, she reported none. She has not had a previous visit with a dietitian. She rarely participates in exercise. (She presents today for her consultation with no meter or logs to review.  She does not routinely monitor glucose.  Her previsit A1c on 06/12/21 was 7.4%.  She  drinks mostly coffee, diet soda, and wine (very little water-says it makes her physically sick).  She eats 3 meals per day with some snacks.  She does not engage in routine physical activity.  She is UTD on eye exam.  She notes she has an erratic sleeping pattern.  She has a history of gastric bypass surgery.  ) An ACE inhibitor/angiotensin II receptor blocker is being taken. She does not see a podiatrist.Eye exam is current.  ? ? ?Review of systems ? ?Constitutional: + Minimally fluctuating body weight, current Body mass index is 30.33 kg/m?., + fatigue, no subjective hyperthermia, no subjective hypothermia ?Eyes: no blurry vision, no xerophthalmia ?ENT: no sore throat, no nodules palpated in throat, no dysphagia/odynophagia, no hoarseness ?Cardiovascular: no chest pain, no shortness of breath, no palpitations, no leg swelling ?Respiratory: no cough, no shortness of breath ?Gastrointestinal: + intermittent nausea/vomiting/diarrhea ?Musculoskeletal: no muscle/joint aches ?Skin: no rashes, no hyperemia ?Neurological: no tremors, no numbness, no tingling, no dizziness ?Psychiatric: + depression, no anxiety ? ?Objective:  ?  ? ?BP 125/66   Pulse 63   Ht '5\' 2"'$  (1.575 m)   Wt 165 lb 12.8 oz (75.2 kg)   SpO2 99%   BMI 30.33 kg/m?   ?Wt Readings from Last 3 Encounters:  ?07/17/21 165 lb 12.8 oz (75.2 kg)  ?02/10/18 171 lb 8 oz (77.8 kg)  ?02/04/18 168 lb (76.2 kg)  ?  ? ?BP Readings from Last 3 Encounters:  ?07/17/21 125/66  ?02/10/18 128/70   ?02/04/18 (!) 149/87  ?  ? ?Physical Exam- Limited ? ?Constitutional:  Body mass index is 30.33 kg/m?. , not in acute distress, normal state of mind ?Eyes:  EOMI, no exophthalmos ?Neck: Supple ?Musculoskeletal: n

## 2021-07-18 LAB — TSH: TSH: 1.51 u[IU]/mL (ref 0.450–4.500)

## 2021-07-18 LAB — T3, FREE: T3, Free: 2.4 pg/mL (ref 2.0–4.4)

## 2021-07-18 LAB — T4, FREE: Free T4: 1.05 ng/dL (ref 0.82–1.77)

## 2021-07-25 ENCOUNTER — Telehealth: Payer: Self-pay | Admitting: Nurse Practitioner

## 2021-07-25 NOTE — Telephone Encounter (Signed)
Left patient a detailed VM with this information  ?

## 2021-07-25 NOTE — Telephone Encounter (Signed)
Informed patient

## 2021-07-25 NOTE — Telephone Encounter (Signed)
Patient said she took her metformin last night because it was written on her paper to do that. I advised her your note says in the morning with breakfast. Does she need to take it today since she took it last night. Thank you ?

## 2021-07-25 NOTE — Telephone Encounter (Signed)
No, she can skip it today and start it tomorrow morning.

## 2021-07-31 ENCOUNTER — Ambulatory Visit (INDEPENDENT_AMBULATORY_CARE_PROVIDER_SITE_OTHER): Payer: Medicare Other | Admitting: Nurse Practitioner

## 2021-07-31 ENCOUNTER — Encounter: Payer: Self-pay | Admitting: Nurse Practitioner

## 2021-07-31 VITALS — BP 161/64 | HR 61 | Ht 62.0 in | Wt 167.2 lb

## 2021-07-31 DIAGNOSIS — I10 Essential (primary) hypertension: Secondary | ICD-10-CM

## 2021-07-31 DIAGNOSIS — E782 Mixed hyperlipidemia: Secondary | ICD-10-CM | POA: Diagnosis not present

## 2021-07-31 DIAGNOSIS — E119 Type 2 diabetes mellitus without complications: Secondary | ICD-10-CM | POA: Diagnosis not present

## 2021-07-31 LAB — POCT UA - MICROALBUMIN
Albumin/Creatinine Ratio, Urine, POC: <30
Creatinine, POC: 10 mg/dL
Microalbumin Ur, POC: 10 mg/L

## 2021-07-31 NOTE — Progress Notes (Signed)
? ?                                                    Endocrinology Follow Up Note  ?     07/31/2021, 12:13 PM ? ? ?Subjective:  ? ? Patient ID: Vickie Ward, female    DOB: [birth year].  ?Vickie Ward is being seen in follow up after being seen in consultation for management of currently uncontrolled symptomatic diabetes requested by  Ephriam Jenkins E. ? ? ?Past Medical History:  ?Diagnosis Date  ? Anemia   ? Arthritis   ? Cervical myelopathy ([place]) 02/04/2018  ? Diabetes mellitus without complication ([place])   ? Factor V Leiden ([place])   ? GERD (gastroesophageal reflux disease)   ? pt takes OTC meds  ? Headache(784.0)   ? Insomnia   ? Numbness and tingling   ? in L arm from neck issues  ? Osteoporosis   ? Overactive bladder   ? takes Vesicare  ? PONV (postoperative nausea and vomiting)   ? Sleep apnea   ? sleep study done > 53yrs ago;no cpap and since lost weight much improvement  ? UTI (lower urinary tract infection)   ? currently taking Cipro  ? Visual floaters   ? ? ?Past Surgical History:  ?Procedure Laterality Date  ? ABDOMINAL HYSTERECTOMY    ? ANTERIOR CERVICAL DECOMP/DISCECTOMY FUSION N/A 03/18/2013  ? Procedure: ACDF C4 - C7 3 LEVELS;  Surgeon: Melina Schools, MD;  Location: [facility];  Service: Orthopedics;  Laterality: N/A;  ? COLONOSCOPY    ? DILATION AND CURETTAGE OF UTERUS    ? REPLACEMENT TOTAL KNEE    ? 2019  ? TONSILLECTOMY    ? adenoidectomy  ? ? ?Social History  ? ?Socioeconomic History  ? Marital status: Married  ?  Spouse name: Not on file  ? Number of children: Not on file  ? Years of education: Not on file  ? Highest education level: Not on file  ?Occupational History  ? Occupation: Retired  ?Tobacco Use  ? Smoking status: Never  ? Smokeless tobacco: Never  ?Vaping Use  ? Vaping Use: Never used  ?Substance and Sexual Activity  ? Alcohol use: Yes  ?  Alcohol/week: 1.0 - 2.0 standard drink  ?  Types: 1 - 2 Glasses of wine per week  ?  Comment: daily  ? Drug use: Never  ? Sexual  activity: Not on file  ?Other Topics Concern  ? Not on file  ?Social History Narrative  ? Not on file  ? ?Social Determinants of Health  ? ?Financial Resource Strain: Not on file  ?Food Insecurity: Not on file  ?Transportation Needs: Not on file  ?Physical Activity: Not on file  ?Stress: Not on file  ?Social Connections: Not on file  ? ? ?Family History  ?Problem Relation Age of Onset  ? Heart attack Mother   ? Hypertension Mother   ? Diabetes type II Mother   ? High Cholesterol Mother   ? Hyperlipidemia Mother   ? Heart failure Mother   ? Diabetes type II Father   ? Lung cancer Father   ? Hypertension Sister   ? Vascular Disease Sister   ? Diabetes type II Sister   ? COPD Brother   ? Drug abuse Brother   ? Drug abuse Brother   ?  Stomach cancer Brother   ? ? ?Outpatient Encounter Medications as of 07/31/2021  ?Medication Sig  ? ALLERGY RELIEF D-12 5-120 MG tablet SMARTSIG:1 Tablet(s) By Mouth Every 12 Hours  ? amoxicillin (AMOXIL) 500 MG capsule SMARTSIG:4 Capsule(s) By Mouth  ? Blood Glucose Monitoring Suppl (ONETOUCH VERIO REFLECT) w/Device KIT daily.  ? cholecalciferol (VITAMIN D) 1000 units tablet Take 6,000 Units by mouth daily.  ? cyanocobalamin (,VITAMIN B-12,) 1000 MCG/ML injection Inject 1,000 mcg into the muscle every 30 (thirty) days.  ? cyanocobalamin 1000 MCG tablet Take by mouth.  ? estradiol (ESTRACE) 0.1 MG/GM vaginal cream estradiol 0.01% (0.1 mg/gram) vaginal cream ? apply a pea sized AMOUNT VAGINALLY (bladder opening at the vagina) AS DIRECTED NIGHTLY FOR SIX WEEKS, THEN TWICE A WEEK FOR maintenance  ? glucose blood (ONETOUCH VERIO) test strip 1 each by Other route as needed for other. Use as instructed  ? Melatonin-Pyridoxine 10-10 MG TBCR Take by mouth.  ? metFORMIN (GLUCOPHAGE-XR) 500 MG 24 hr tablet Take 500 mg by mouth daily.  ? nitroGLYCERIN (NITROSTAT) 0.4 MG SL tablet nitroglycerin 0.4 mg sublingual tablet ? TABLET 0.$RemoveBe'4MG'EjTcReqll$  UNDER THE TONGUE EVERY FIVE MINUTES AS NEEDED FOR CHEST PAIN. DO NOT  EXCEED THREE DOSES IN 15 MINUTES  ? Omega-3 1000 MG CAPS Take by mouth.  ? ramipril (ALTACE) 2.5 MG capsule Take 2.5 mg by mouth daily.  ? zolpidem (AMBIEN) 10 MG tablet Take 5 mg by mouth at bedtime as needed for sleep.  ? ?No facility-administered encounter medications on file as of 07/31/2021.  ? ? ?ALLERGIES: ?Allergies  ?Allergen Reactions  ? Cephalexin Other (See Comments) and Rash  ?  Per patient, may have caused itching or swelling ?Other reaction(s): Other (See Comments) ?Pt. States of the face ?Other Reaction: Other reaction ?Per patient, may have caused itching or swelling ?  ? Codeine Nausea And Vomiting  ? Nitrofurantoin Rash  ?  Other reaction(s): Other (See Comments) ?Mouth Ulcers ?  ? Macrobid [Nitrofurantoin Macrocrystal] Other (See Comments)  ?  Per patient, may have caused itching or swelling  ? Sulfamethoxazole-Trimethoprim   ?  Other reaction(s): Other (See Comments) ?Oral thrush  ? ? ?VACCINATION STATUS: ? ?There is no immunization history on file for this patient. ? ?Diabetes ?She presents for her follow-up diabetic visit. She has type 2 diabetes mellitus. Onset time: Diagnosed at approx age of 30. Her disease course has been fluctuating. Hypoglycemia symptoms include sleepiness. (weakness) Associated symptoms include fatigue and weakness. There are no hypoglycemic complications. Symptoms are stable. There are no diabetic complications. Risk factors for coronary artery disease include diabetes mellitus, dyslipidemia, family history, hypertension, stress, sedentary lifestyle and post-menopausal. Current diabetic treatment includes oral agent (monotherapy). She is compliant with treatment most of the time. Her weight is fluctuating minimally. She is following a generally unhealthy diet. When asked about meal planning, she reported none. She has not had a previous visit with a dietitian. She rarely participates in exercise. Her home blood glucose trend is fluctuating minimally. Her breakfast blood  glucose range is generally 180-200 mg/dl. Her lunch blood glucose range is generally 140-180 mg/dl. Her dinner blood glucose range is generally 140-180 mg/dl. Her bedtime blood glucose range is generally 180-200 mg/dl. (She presents today with her meter and logs showing slowly improving glycemic profile overall.  She was not due for another A1c today.  She was concerned that her pharmacy make an error and gave her medication for anxiety (Wellbutrin 300 mg) instead of the Metformin.  She did  show me her medications and one pill was similar to Metformin in shape and size and had the same imprint (only with subtle differences).  They were all Metformin 500 mg ER tabs (likely from different manufacturers)  She continues to drink little water, still drinks iced coffee with cream and sugar daily.  She has an appointment with Kieth Brightly next week for help with her diet.  She has been eating more than 3 meals per day given her previous bariatric surgery.  She denies low readings.) An ACE inhibitor/angiotensin II receptor blocker is being taken. She does not see a podiatrist.Eye exam is current.  ? ? ?Review of systems ? ?Constitutional: + Minimally fluctuating body weight, current Body mass index is 30.58 kg/m?., + fatigue, no subjective hyperthermia, no subjective hypothermia ?Eyes: no blurry vision, no xerophthalmia ?ENT: no sore throat, no nodules palpated in throat, no dysphagia/odynophagia, no hoarseness ?Cardiovascular: no chest pain, no shortness of breath, no palpitations, no leg swelling ?Respiratory: no cough, no shortness of breath ?Gastrointestinal: + intermittent nausea/vomiting/diarrhea- improved since lowering Metformin ?Musculoskeletal: no muscle/joint aches ?Skin: no rashes, no hyperemia ?Neurological: no tremors, no numbness, no tingling, no dizziness ?Psychiatric: + depression, + anxiety ? ?Objective:  ?  ? ?BP (!) 161/64   Pulse 61   Ht $R'5\' 2"'RH$  (1.950 m)   Wt 167 lb 3.2 oz (75.8 kg)   SpO2 100%   BMI 30.58  kg/m?   ?Wt Readings from Last 3 Encounters:  ?07/31/21 167 lb 3.2 oz (75.8 kg)  ?07/17/21 165 lb 12.8 oz (75.2 kg)  ?02/10/18 171 lb 8 oz (77.8 kg)  ?  ? ?BP Readings from Last 3 Encounters:  ?07/31/21 Marland Kitchen

## 2021-07-31 NOTE — Patient Instructions (Signed)

## 2021-08-08 ENCOUNTER — Telehealth: Payer: Self-pay | Admitting: Nurse Practitioner

## 2021-08-08 NOTE — Telephone Encounter (Signed)
Patient said she had her thyroid labs a few weeks ago but never heard about the results. Call back # [phone] ?

## 2021-08-08 NOTE — Telephone Encounter (Signed)
Called the patient and gave her the message from [place]. Patient verbalized an understanding. ?

## 2021-08-08 NOTE — Telephone Encounter (Signed)
Her thyroid labs were all completely normal, meaning her thyroid is not a contributing cause to her symptoms.  I apologize, I must've overlooked the results when they came in.

## 2021-08-13 ENCOUNTER — Encounter: Payer: Medicare Other | Attending: Nurse Practitioner | Admitting: Nutrition

## 2021-08-13 ENCOUNTER — Encounter: Payer: Self-pay | Admitting: Nutrition

## 2021-08-13 VITALS — Ht 62.0 in | Wt 167.2 lb

## 2021-08-13 DIAGNOSIS — Z713 Dietary counseling and surveillance: Secondary | ICD-10-CM | POA: Diagnosis not present

## 2021-08-13 DIAGNOSIS — Z6830 Body mass index (BMI) 30.0-30.9, adult: Secondary | ICD-10-CM | POA: Diagnosis not present

## 2021-08-13 DIAGNOSIS — Z7984 Long term (current) use of oral hypoglycemic drugs: Secondary | ICD-10-CM | POA: Diagnosis not present

## 2021-08-13 DIAGNOSIS — E118 Type 2 diabetes mellitus with unspecified complications: Secondary | ICD-10-CM

## 2021-08-13 DIAGNOSIS — E669 Obesity, unspecified: Secondary | ICD-10-CM

## 2021-08-13 DIAGNOSIS — E119 Type 2 diabetes mellitus without complications: Secondary | ICD-10-CM | POA: Insufficient documentation

## 2021-08-13 DIAGNOSIS — Z9884 Bariatric surgery status: Secondary | ICD-10-CM

## 2021-08-13 NOTE — Patient Instructions (Signed)
Goals ?Drink 2 protein drinks per day ?Increase low carb veggies ?Work on eating and drinking schedule  ?Make sure vitamins are correct ?Take metformin after breakfast ?Cut out out the mocha coffee ?Aim for am blood sugars less than to 130 and less than 150 at bedtime ? ?

## 2021-08-13 NOTE — Progress Notes (Signed)
Medical Nutrition Therapy  ?Appointment Start time:  0800  Appointment End time:  0915 ? ?Primary concerns today: Dm Type 2  ?Referral diagnosis: E11/9 ?Preferred learning style: Hear and See  ?Learning readiness: Changes in progress ? ? ?NUTRITION ASSESSMENT  ?76 yr old female with history of gastric bypass in 2018. Not really following a bariatric lifestyle. Has gained back 30 lbs. Can't eat much at one time. Drinks a lot of mocha coffees. ?Not followed by bariartics anymore. Admits she forgot a lot of what they told her. She notes she didn't really change her eating habits or exercise, the "weight just came off without me doing anything." ?Taking  [place] vitamin once a day. ?Surgery done at [facility] 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  ?She complains of chronic fatigue, poor ability to sleep at night. Is back to taking sleeping medcations.  ? ?Complains of leg cramps. May need more magnesium and calcium. Not taking bariatric vitamins as she should be taking. Not getting in enough protein or low carb vegetables. ?Current diet is not meeting her needs nutritionally. ? ? ?Testing BS BID. FBS ranges 101-190's  Bedtime 127-218 mg/dl. ? ?She admits there is room for improvement with her diet and wiling to work on more plant based lifestyle. ? ?Anthropometrics  ?Wt Readings from Last 3 Encounters:  ?07/31/21 167 lb 3.2 oz (75.8 kg)  ?07/17/21 165 lb 12.8 oz (75.2 kg)  ?02/10/18 171 lb 8 oz (77.8 kg)  ? ?Ht Readings from Last 3 Encounters:  ?07/31/21 '5\' 2"'$  (1.575 m)  ?07/17/21 '5\' 2"'$  (1.575 m)  ?02/10/18 '5\' 2"'$  (1.575 m)  ? ?There is no height or weight on file to calculate BMI. ?'@BMIFA'$ @ ?Facility age limit for growth percentiles is 20 years. ?Facility age limit for growth percentiles is 20 years. ?  ? ?Clinical ?Medical Hx: See chart ?Medications: Metformin 500 mg ER once a day;  ?Labs:  ?Lab Results   ?Component Value Date  ? HGBA1C 7.4 06/12/2021  ? ? ?Notable Signs/Symptoms: Increased urination. Fatigue ? ?Lifestyle & Dietary Hx ?Lives with her husband. Had gastric bypass 2010 and lost 90 lbs. ?Initial weight 245 lbs.  ? ?Estimated daily fluid intake: 100  oz ?Supplements: Vit B12,  ?Sleep: 6-8 hrs with ambien ?Stress / self-care: none ?Current average weekly physical activity: started walking with her husband.  ? ?24-Hr Dietary Recall ?First Meal:  8 am1 cheese toast, ice coffee 1/2 and 1/2 flavored mocha ?Snack: nabs 4 ?Second Meal: [place], glass of crushed ice with white wine ?Snack: Peanut nuggets-8-10,  crushed ice or iced coffee ?Third Meal: Spaghetti with meat sauce 1/2 slice garlic bread, pepsi diet ?Snack: handful of almonds  ?Beverages: ice coffee with 1/2 and 1/2, wine,  ? ?Estimated Energy Needs ?Calories: 1200 ?Carbohydrate: 135g ?Protein: 60-80g ?Fat: 32g ? ? ?NUTRITION DIAGNOSIS  ?NB-1.1 Food and nutrition-related knowledge deficit As related to Diabetes .  As evidenced by A1C 7.4%. ? ? ?NUTRITION INTERVENTION  ?Nutrition education (E-1) on the following topics:  ?Nutrition and Diabetes education provided on My Plate, CHO counting, meal planning, portion sizes, timing of meals, avoiding snacks between meals unless having a low blood sugar, target ranges for A1C and blood sugars, signs/symptoms and treatment of hyper/hypoglycemia, monitoring blood sugars, taking medications as prescribed, benefits of exercising 30 minutes per day and prevention of complications of DM. ? ?Bariatric nutrition, meal planning,  chewing, not drinking liquids with meals and bring mindful when eating. Vitamin and mineral needs. ? ?Handouts Provided Include  ?Nutrition and Diabetes education provided on My Plate, CHO counting, meal planning, portion sizes, timing of meals, avoiding snacks between meals unless having a low blood sugar, target ranges for A1C and blood sugars, signs/symptoms and treatment of  hyper/hypoglycemia, monitoring blood sugars, taking medications as prescribed, benefits of exercising 30 minutes per day and prevention of complications of DM. ?Lifestyle Medicine ?- Whole Food, Plant Predominant Nutrition is highly recommended: Eat Plenty of vegetables, Mushrooms, fruits, Legumes, Whole Grains, Nuts, seeds in lieu of processed meats, processed snacks/pastries red meat, poultry, eggs.  ?  ?-It is better to avoid simple carbohydrates including: Cakes, Sweet Desserts, Ice Cream, Soda (diet and regular), Sweet Tea, Candies, Chips, Cookies, Store Bought Juices, Alcohol in Excess of  1-2 drinks a day, Lemonade,  Artificial Sweeteners, Doughnuts, Coffee Creamers, "Sugar-free" Products, etc, etc.  This is not a complete list..... ? ?Exercise: If you are able: 30 -60 minutes a day ,4 days a week, or 150 minutes a week.  The longer the better.  Combine stretch, strength, and aerobic activities.  If you were told in the past that you have high risk for cardiovascular diseases, you may seek evaluation by your heart doctor prior to initiating moderate to intense exercise programs. ? ? ?Learning Style & Readiness for Change ?Teaching method utilized: Visual & Auditory  ?Demonstrated degree of understanding via: Teach Back  ?Barriers to learning/adherence to lifestyle change: none ? ?Goals Established by Pt ?Goals ?Drink 2 protein drinks per day ?Increase low carb veggies ?Work on eating and drinking schedule  ?Make sure vitamins are correct ?Take metformin after breakfast ?Cut out out the mocha coffee ?Aim for am blood sugars less than to 130 and less than 150 at bedtime ? ? ?MONITORING & EVALUATION ?Dietary intake, weekly physical activity, and blood sugars and diet in 1 month. ? ?Next Steps  ?Patient is to work on following bariatric meal planning and exercise.Marland Kitchen ? ?

## 2021-09-12 ENCOUNTER — Ambulatory Visit: Payer: Medicare Other | Admitting: Nutrition

## 2021-09-18 ENCOUNTER — Ambulatory Visit: Payer: Medicare Other | Admitting: Nutrition

## 2021-09-24 ENCOUNTER — Ambulatory Visit: Payer: Medicare Other | Admitting: Nutrition

## 2021-09-26 ENCOUNTER — Encounter: Payer: Medicare Other | Attending: Nurse Practitioner | Admitting: Nutrition

## 2021-09-26 ENCOUNTER — Encounter: Payer: Self-pay | Admitting: Nutrition

## 2021-09-26 VITALS — Ht 65.0 in | Wt 167.0 lb

## 2021-09-26 DIAGNOSIS — Z9884 Bariatric surgery status: Secondary | ICD-10-CM | POA: Diagnosis present

## 2021-09-26 DIAGNOSIS — E118 Type 2 diabetes mellitus with unspecified complications: Secondary | ICD-10-CM | POA: Diagnosis present

## 2021-09-26 DIAGNOSIS — E669 Obesity, unspecified: Secondary | ICD-10-CM | POA: Insufficient documentation

## 2021-09-26 NOTE — Progress Notes (Signed)
Medical Nutrition Therapy DM Follow up Appointment Start time:  0915  Appointment End time: 0945  Primary concerns today: Dm Type 2  Referral diagnosis: E11/9 Preferred learning style: Hear and See  Learning readiness: Changes in progress   NUTRITION ASSESSMENT  76 yr old female with history of gastric bypass in 2018. Here with her support dog. Changes made: She is trying to work on eating better food choices. Feels some better. Admits she still has room for improvement. Still only likes ice and not drinking any water outside of the ice. Tends to graze and not eat better balanced meals. Willing to keep working on eating more plant based foods and avoid processed and refined sugars. Would like to see a counselor related to some childhood and adult issues. Encouraged her to talk to her PCP for a referral to behavior health.  No weight change.  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  Had covid 3 weeks ago. Only symptom was she was exhausted. Changes made: Drinking premier drinks- down to 2 per day. She feels a lot better. Not sluggish anymore.  Her bowels are more regular and not diarhhea anymor. FBS: 154-240's, Bedtime 160-250's.  Changes: Trying to make better food choices. Trying to cut out the things she shouldn't eat..   Testing BS BID. FBS ranges 101-190's  Bedtime 127-218 mg/dl.  She admits there is room for improvement with her diet and wiling to work on more plant based lifestyle.  Anthropometrics  Wt Readings from Last 3 Encounters:  08/13/21 167 lb 3.2 oz (75.8 kg)  07/31/21 167 lb 3.2 oz (75.8 kg)  07/17/21 165 lb 12.8 oz (75.2 kg)   Ht Readings from Last 3 Encounters:  08/13/21 '5\' 2"'$  (1.575 m)  07/31/21 '5\' 2"'$  (1.575 m)  07/17/21 '5\' 2"'$  (1.575 m)   There is no height or weight on file to calculate BMI. '@BMIFA'$ @ Facility age limit for growth percentiles is 20 years. Facility age limit for growth percentiles is 20 years.    Clinical Medical Hx: See chart Medications:  Metformin 500 mg ER once a day;  Labs:  Lab Results  Component Value Date   HGBA1C 7.4 06/12/2021    Notable Signs/Symptoms: Increased urination. Fatigue  Lifestyle & Dietary Hx Lives with her husband. Had gastric bypass 2010 and lost 90 lbs. Initial weight 245 lbs.   Estimated daily fluid intake: 100  oz Supplements: Vit B12,  Sleep: 6-8 hrs with ambien Stress / self-care: none Current average weekly physical activity: started walking with her husband.   24-Hr Dietary Recall First Meal:  Oatmeal instant-1 packet maple and brown regular. Protein shake Second Meal: CHicken strip and few fries, Sweet tea Snack: Crackers- 15, Wine with ice Third Meal:  Green beans, roast and apples fried apples with cinnamon. Beverages:Diet pepsi 16 oz, ice,   Estimated Energy Needs Calories: 1200 Carbohydrate: 135g Protein: 60-80g Fat: 32g   NUTRITION DIAGNOSIS  NB-1.1 Food and nutrition-related knowledge deficit As related to Diabetes .  As evidenced by A1C 7.4%.   NUTRITION INTERVENTION  Nutrition education (E-1) on the following topics:  Nutrition and Diabetes education provided on My Plate, CHO counting, meal planning, portion sizes, timing of meals, avoiding snacks between meals unless having a low blood sugar, target ranges for A1C and blood sugars, signs/symptoms and treatment of hyper/hypoglycemia, monitoring blood sugars, taking medications as prescribed, benefits of exercising 30 minutes per day and prevention of complications of DM.  Bariatric nutrition, meal planning, chewing, not drinking liquids with meals and  bring mindful when eating. Vitamin and mineral needs.  Handouts Provided Include  Nutrition and Diabetes education provided on My Plate, CHO counting, meal planning, portion sizes, timing of meals, avoiding snacks between meals unless having a low blood sugar, target ranges for A1C and blood sugars, signs/symptoms and treatment of hyper/hypoglycemia, monitoring blood  sugars, taking medications as prescribed, benefits of exercising 30 minutes per day and prevention of complications of DM. Lifestyle Medicine - Whole Food, Plant Predominant Nutrition is highly recommended: Eat Plenty of vegetables, Mushrooms, fruits, Legumes, Whole Grains, Nuts, seeds in lieu of processed meats, processed snacks/pastries red meat, poultry, eggs.    -It is better to avoid simple carbohydrates including: Cakes, Sweet Desserts, Ice Cream, Soda (diet and regular), Sweet Tea, Candies, Chips, Cookies, Store Bought Juices, Alcohol in Excess of  1-2 drinks a day, Lemonade,  Artificial Sweeteners, Doughnuts, Coffee Creamers, "Sugar-free" Products, etc, etc.  This is not a complete list.....  Exercise: If you are able: 30 -60 minutes a day ,4 days a week, or 150 minutes a week.  The longer the better.  Combine stretch, strength, and aerobic activities.  If you were told in the past that you have high risk for cardiovascular diseases, you may seek evaluation by your heart doctor prior to initiating moderate to intense exercise programs.   Learning Style & Readiness for Change Teaching method utilized: Visual & Auditory  Demonstrated degree of understanding via: Teach Back  Barriers to learning/adherence to lifestyle change: none  Goals Established by Pt Goals  Focus on more on more whole fruits and vegetables and dried beans. Drink 2-16 oz of bottles of water per day Eat boiled egg and some veggies after supper instead of popcorn. Aiim for morning blood sugars less than 130 mg/dl and less than 150 mg/dl at bedtime.. Check  with MD about referral to a counselor MONITORING & EVALUATION Dietary intake, weekly physical activity, and blood sugars and diet in 1 month.  Next Steps  Patient is to work on following bariatric meal planning and exercise.Marland Kitchen

## 2021-09-26 NOTE — Patient Instructions (Addendum)
Goals  Focus on more on more whole fruits and vegetables and dried beans. Drink 2-16 oz of bottles of water per day Eat boiled egg and some veggies after supper instead of popcorn. Aiim for morning blood sugars less than 130 mg/dl and less than 150 mg/dl at bedtime. Check  with MD about referral to a counselor

## 2021-10-31 ENCOUNTER — Ambulatory Visit: Payer: Medicare Other | Admitting: Nurse Practitioner

## 2021-11-12 ENCOUNTER — Encounter: Payer: Medicare Other | Attending: Nurse Practitioner | Admitting: Nutrition

## 2021-11-12 ENCOUNTER — Encounter: Payer: Self-pay | Admitting: Nurse Practitioner

## 2021-11-12 ENCOUNTER — Ambulatory Visit (INDEPENDENT_AMBULATORY_CARE_PROVIDER_SITE_OTHER): Payer: Medicare Other | Admitting: Nurse Practitioner

## 2021-11-12 VITALS — Ht 62.0 in | Wt 167.0 lb

## 2021-11-12 VITALS — BP 152/78 | HR 58 | Ht 62.0 in | Wt 167.0 lb

## 2021-11-12 DIAGNOSIS — Z713 Dietary counseling and surveillance: Secondary | ICD-10-CM | POA: Diagnosis not present

## 2021-11-12 DIAGNOSIS — E669 Obesity, unspecified: Secondary | ICD-10-CM | POA: Insufficient documentation

## 2021-11-12 DIAGNOSIS — E782 Mixed hyperlipidemia: Secondary | ICD-10-CM

## 2021-11-12 DIAGNOSIS — E118 Type 2 diabetes mellitus with unspecified complications: Secondary | ICD-10-CM

## 2021-11-12 DIAGNOSIS — I10 Essential (primary) hypertension: Secondary | ICD-10-CM | POA: Diagnosis not present

## 2021-11-12 DIAGNOSIS — E119 Type 2 diabetes mellitus without complications: Secondary | ICD-10-CM

## 2021-11-12 DIAGNOSIS — Z9884 Bariatric surgery status: Secondary | ICD-10-CM | POA: Insufficient documentation

## 2021-11-12 DIAGNOSIS — E559 Vitamin D deficiency, unspecified: Secondary | ICD-10-CM

## 2021-11-12 DIAGNOSIS — R5382 Chronic fatigue, unspecified: Secondary | ICD-10-CM

## 2021-11-12 LAB — POCT GLYCOSYLATED HEMOGLOBIN (HGB A1C): HbA1c POC (<> result, manual entry): 7.5 % (ref 4.0–5.6)

## 2021-11-12 MED ORDER — METFORMIN HCL ER 500 MG PO TB24: 500.0000 mg | ORAL_TABLET | Freq: Every day | ORAL | 3 refills | Status: DC

## 2021-11-12 NOTE — Patient Instructions (Signed)
Goals  Continue to increase plant based foods Ask DM to check Vit D levels. Check BS when feeling weak/shaky. Get A1C to 7% or less.

## 2021-11-12 NOTE — Progress Notes (Signed)
Medical Nutrition Therapy DM Follow up Appointment Start time:  0915  Appointment End time: 0945  Primary concerns today: Dm Type 2  Referral diagnosis: E11/9 Preferred learning style: Hear and See  Learning readiness: Changes in progress   NUTRITION ASSESSMENT  76 yr old female with history of gastric bypass in 2018.   Only taking 500 mg of Metformin a day now. Had a lot of GI issues with previous doses of Metformin. Saw Rayetta Pigg, REI today. Going to test FBS only from now and when she feels weak or exhausted to make sure it's not low blood sugars. CHanges made: have been drinking more water with lemon. Eating more plant based foods. Drinking 1 premeir shakes daily.  Still drinking a Pepsi a day- a few sips when she does drink one.Marland Kitchen Has been more tired lately. Complains of weak spell and bad hot flashes and then is exhausted.  FBS:160's Bedtime: 140-180's. Cut out sweets. Eating three meals per day. May not be enough enough at times and may be causing some low blood sugars.  A1C 7.5% from 7.4% but was on 2 g Metformin previously. Trying to avoid eating animal products and more whole plant based.  Wt is stable.   Anthropometrics  Wt Readings from Last 3 Encounters:  11/12/21 167 lb (75.8 kg)  09/26/21 167 lb (75.8 kg)  08/13/21 167 lb 3.2 oz (75.8 kg)   Ht Readings from Last 3 Encounters:  11/12/21 '5\' 2"'$  (1.575 m)  09/26/21 '5\' 5"'$  (1.651 m)  08/13/21 '5\' 2"'$  (1.575 m)   There is no height or weight on file to calculate BMI. '@BMIFA'$ @ Facility age limit for growth %iles is 20 years. Facility age limit for growth %iles is 20 years.    Clinical Medical Hx: See chart Medications: Metformin 500 mg ER once a day;  Labs:  Lab Results  Component Value Date   HGBA1C 7.5 11/12/2021    Notable Signs/Symptoms: Increased urination. Fatigue  Lifestyle & Dietary Hx Lives with her husband. Had gastric bypass 2010 and lost 90 lbs. Initial weight 245 lbs.   Estimated daily  fluid intake: 100  oz Supplements: Vit B12,  Sleep: 6-8 hrs with ambien Stress / self-care: none Current average weekly physical activity: started walking with her husband.   24-Hr Dietary Recall First Meal:  2 eggs  waffles,;  Second Meal: 1 slice of pizza, water Snack: pb Third Meal:  Pinto beans,  piece of salsbury steak,  mashed potatoes, water Beverages:Diet pepsi 16 oz, ice,   Estimated Energy Needs Calories: 1200 Carbohydrate: 135g Protein: 60-80g Fat: 32g   NUTRITION DIAGNOSIS  NB-1.1 Food and nutrition-related knowledge deficit As related to Diabetes .  As evidenced by A1C 7.4%.   NUTRITION INTERVENTION  Nutrition education (E-1) on the following topics:  Nutrition and Diabetes education provided on My Plate, CHO counting, meal planning, portion sizes, timing of meals, avoiding snacks between meals unless having a low blood sugar, target ranges for A1C and blood sugars, signs/symptoms and treatment of hyper/hypoglycemia, monitoring blood sugars, taking medications as prescribed, benefits of exercising 30 minutes per day and prevention of complications of DM.  Bariatric nutrition, meal planning, chewing, not drinking liquids with meals and bring mindful when eating. Vitamin and mineral needs.  Handouts Provided Include  Nutrition and Diabetes education provided on My Plate, CHO counting, meal planning, portion sizes, timing of meals, avoiding snacks between meals unless having a low blood sugar, target ranges for A1C and blood sugars, signs/symptoms and treatment of  hyper/hypoglycemia, monitoring blood sugars, taking medications as prescribed, benefits of exercising 30 minutes per day and prevention of complications of DM. Lifestyle Medicine - Whole Food, Plant Predominant Nutrition is highly recommended: Eat Plenty of vegetables, Mushrooms, fruits, Legumes, Whole Grains, Nuts, seeds in lieu of processed meats, processed snacks/pastries red meat, poultry, eggs.    -It is  better to avoid simple carbohydrates including: Cakes, Sweet Desserts, Ice Cream, Soda (diet and regular), Sweet Tea, Candies, Chips, Cookies, Store Bought Juices, Alcohol in Excess of  1-2 drinks a day, Lemonade,  Artificial Sweeteners, Doughnuts, Coffee Creamers, "Sugar-free" Products, etc, etc.  This is not a complete list.....  Exercise: If you are able: 30 -60 minutes a day ,4 days a week, or 150 minutes a week.  The longer the better.  Combine stretch, strength, and aerobic activities.  If you were told in the past that you have high risk for cardiovascular diseases, you may seek evaluation by your heart doctor prior to initiating moderate to intense exercise programs.   Learning Style & Readiness for Change Teaching method utilized: Visual & Auditory  Demonstrated degree of understanding via: Teach Back  Barriers to learning/adherence to lifestyle change: none  Goals Established by Pt Goals  Continue to increase plant based foods Ask DM to check Vit D levels. Check BS when feeling weak/shaky. Get A1C to 7% or less.  MONITORING & EVALUATION Dietary intake, weekly physical activity, and blood sugars and diet in 3 month.  Next Steps  Patient is to work on following bariatric meal planning and exercise.Marland Kitchen

## 2021-11-12 NOTE — Progress Notes (Signed)
Endocrinology Follow Up Note       11/12/2021, 9:10 AM   Subjective:    Patient ID: Vickie Ward, female    DOB: 1945/10/05.  Vickie Ward is being seen in follow up after being seen in consultation for management of currently uncontrolled symptomatic diabetes requested by  Thea Alken.   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[place])    GERD (gastroesophageal reflux disease)    pt takes OTC meds   Headache(784.0)    Insomnia    Numbness and tingling    in L arm from neck issues   Osteoporosis    Overactive bladder    takes Vesicare   PONV (postoperative nausea and vomiting)    Sleep apnea    sleep study done > 68yr ago;no cpap and since lost weight much improvement   UTI (lower urinary tract infection)    currently taking Cipro   Visual floaters     Past Surgical History:  Procedure Laterality Date   ABDOMINAL HYSTERECTOMY     ANTERIOR CERVICAL DECOMP/DISCECTOMY FUSION N/A 03/18/2013   Procedure: ACDF C4 - C7 3 LEVELS;  Surgeon: DMelina Schools MD;  Location: [facility]  Service: Orthopedics;  Laterality: N/A;   COLONOSCOPY     DILATION AND CURETTAGE OF UTERUS     REPLACEMENT TOTAL KNEE     2019   TONSILLECTOMY     adenoidectomy    Social History   Socioeconomic History   Marital status: Married    Spouse name: Not on file   Number of children: Not on file   Years of education: Not on file   Highest education level: Not on file  Occupational History   Occupation: Retired  Tobacco Use   Smoking status: Never   Smokeless tobacco: Never  Vaping Use   Vaping Use: Never used  Substance and Sexual Activity   Alcohol use: Yes    Alcohol/week: 1.0 - 2.0 standard drink of alcohol    Types: 1 - 2 Glasses of wine per week    Comment: daily   Drug use:  Never   Sexual activity: Not on file  Other Topics Concern   Not on file  Social History Narrative   Not on file   Social Determinants of Health   Financial Resource Strain: Not on file  Food Insecurity: Not on file  Transportation Needs: Not on file  Physical Activity: Not on file  Stress: Not on file  Social Connections: Not on file    Family History  Problem Relation Age of Onset   Heart attack Mother    Hypertension Mother    Diabetes type II Mother    High Cholesterol Mother    Hyperlipidemia Mother    Heart failure Mother    Diabetes type II Father    Lung cancer Father    Hypertension Sister    Vascular Disease Sister    Diabetes type II Sister    COPD Brother    Drug abuse Brother    Drug  abuse Brother    Stomach cancer Brother     Outpatient Encounter Medications as of 11/12/2021  Medication Sig   ALLERGY RELIEF D-12 5-120 MG tablet SMARTSIG:1 Tablet(s) By Mouth Every 12 Hours   amoxicillin (AMOXIL) 500 MG capsule SMARTSIG:4 Capsule(s) By Mouth (Patient not taking: Reported on 09/26/2021)   cholecalciferol (VITAMIN D) 1000 units tablet Take 6,000 Units by mouth daily.   cyanocobalamin (,VITAMIN B-12,) 1000 MCG/ML injection Inject 1,000 mcg into the muscle every 30 (thirty) days.   cyanocobalamin 1000 MCG tablet Take by mouth.   estradiol (ESTRACE) 0.1 MG/GM vaginal cream estradiol 0.01% (0.1 mg/gram) vaginal cream  apply a pea sized AMOUNT VAGINALLY (bladder opening at the vagina) AS DIRECTED NIGHTLY FOR SIX WEEKS, THEN TWICE A WEEK FOR maintenance   glucose blood (ONETOUCH VERIO) test strip 1 each by Other route as needed for other. Use as instructed   Melatonin-Pyridoxine 10-10 MG TBCR Take by mouth.   metFORMIN (GLUCOPHAGE-XR) 500 MG 24 hr tablet Take 1 tablet (500 mg total) by mouth daily with breakfast.   nitroGLYCERIN (NITROSTAT) 0.4 MG SL tablet nitroglycerin 0.4 mg sublingual tablet  TABLET 0.4MG UNDER THE TONGUE EVERY FIVE MINUTES AS NEEDED FOR CHEST  PAIN. DO NOT EXCEED THREE DOSES IN 15 MINUTES   Omega-3 1000 MG CAPS Take by mouth.   ramipril (ALTACE) 2.5 MG capsule Take 2.5 mg by mouth daily.   zolpidem (AMBIEN) 10 MG tablet Take 5 mg by mouth at bedtime as needed for sleep.   [DISCONTINUED] Blood Glucose Monitoring Suppl (ONETOUCH VERIO REFLECT) w/Device KIT daily.   [DISCONTINUED] metFORMIN (GLUCOPHAGE-XR) 500 MG 24 hr tablet Take 500 mg by mouth daily.   No facility-administered encounter medications on file as of 11/12/2021.    ALLERGIES: Allergies  Allergen Reactions   Cephalexin Other (See Comments) and Rash    Per patient, may have caused itching or swelling Other reaction(s): Other (See Comments) Pt. States of the face Other Reaction: Other reaction Per patient, may have caused itching or swelling    Codeine Nausea And Vomiting   Nitrofurantoin Rash    Other reaction(s): Other (See Comments) Mouth Ulcers    Macrobid [Nitrofurantoin Macrocrystal] Other (See Comments)    Per patient, may have caused itching or swelling   Sulfamethoxazole-Trimethoprim     Other reaction(s): Other (See Comments) Oral thrush    VACCINATION STATUS:  There is no immunization history on file for this patient.  Diabetes She presents for her follow-up diabetic visit. She has type 2 diabetes mellitus. Onset time: Diagnosed at approx age of 78. Her disease course has been stable. There are no hypoglycemic associated symptoms. Associated symptoms include fatigue. Pertinent negatives for diabetes include no weakness. There are no hypoglycemic complications. Symptoms are stable. There are no diabetic complications. Risk factors for coronary artery disease include diabetes mellitus, dyslipidemia, family history, hypertension, stress, sedentary lifestyle and post-menopausal. Current diabetic treatment includes oral agent (monotherapy). She is compliant with treatment most of the time. Her weight is fluctuating minimally. She is following a generally  unhealthy diet. When asked about meal planning, she reported none. She has not had a previous visit with a dietitian. She rarely participates in exercise. Her breakfast blood glucose range is generally 140-180 mg/dl. Her bedtime blood glucose range is generally 140-180 mg/dl. (She presents today with her meter and logs showing mostly at target glycemic profile overall.  Her POCT A1c today is 7.5%, essentially unchanged from previous visit.  This is an acceptable target for her as  it reduces the chance of hypoglycemia which she has had significant episodes of in the past.  ) An ACE inhibitor/angiotensin II receptor blocker is being taken. She does not see a podiatrist.Eye exam is current.     Review of systems  Constitutional: + Minimally fluctuating body weight, current Body mass index is 30.54 kg/m., + fatigue- says these come in waves, no subjective hyperthermia, no subjective hypothermia Eyes: no blurry vision, no xerophthalmia ENT: no sore throat, no nodules palpated in throat, no dysphagia/odynophagia, no hoarseness Cardiovascular: no chest pain, no shortness of breath, no palpitations, no leg swelling Respiratory: no cough, no shortness of breath Gastrointestinal: + intermittent nausea/vomiting/diarrhea- improved since lowering Metformin Musculoskeletal: no muscle/joint aches Skin: no rashes, no hyperemia Neurological: no tremors, no numbness, no tingling, no dizziness Psychiatric: + depression, + anxiety  Objective:     BP (!) 152/78   Pulse (!) 58   Ht _0  (1.575 m)   Wt 167 lb (75.8 kg)   BMI 30.54 kg/m   Wt Readings from Last 3 Encounters:  11/12/21 167 lb (75.8 kg)  09/26/21 167 lb (75.8 kg)  08/13/21 167 lb 3.2 oz (75.8 kg)     BP Readings from Last 3 Encounters:  11/12/21 (!) 152/78  07/31/21 (!) 161/64  07/17/21 125/66      Physical Exam- Limited  Constitutional:  Body mass index is 30.54 kg/m. , not in acute distress, normal state of mind Eyes:  EOMI, no  exophthalmos Neck: Supple Cardiovascular: RRR, no murmurs, rubs, or gallops, no edema Respiratory: Adequate breathing efforts, no crackles, rales, rhonchi, or wheezing Musculoskeletal: no gross deformities, strength intact in all four extremities, no gross restriction of joint movements Skin:  no rashes, no hyperemia Neurological: no tremor with outstretched hands    CMP ( most recent) CMP  No results found for: "NA", "K", "CL", "CO2", "GLUCOSE", "BUN", "CREATININE", "CALCIUM", "PROT", "ALBUMIN", "AST", "ALT", "ALKPHOS", "BILITOT", "GFRNONAA", "GFRAA"   Diabetic Labs (most recent): Lab Results  Component Value Date   HGBA1C 7.5 11/12/2021   HGBA1C 7.4 06/12/2021   HGBA1C 6.7 (H) 03/18/2013   MICROALBUR 10 07/31/2021     Lipid Panel ( most recent) Lipid Panel     Component Value Date/Time   TRIG 109 12/05/2020 0000   [place] 123 12/05/2020 0000      Lab Results  Component Value Date   TSH 1.510 07/17/2021   FREET4 1.05 07/17/2021           Assessment & Plan:   1) Type 2 diabetes mellitus without complication, without long-term current use of insulin ([place])  She presents today with her meter and logs showing mostly at target glycemic profile overall.  Her POCT A1c today is 7.5%, essentially unchanged from previous visit.  This is an acceptable target for her as it reduces the chance of hypoglycemia which she has had significant episodes of in the past.    - LATEIA FRASER has currently uncontrolled symptomatic type 2 DM since 76 years of age.   -Recent labs reviewed.  - I had a long discussion with her about the progressive nature of diabetes and the pathology behind its complications. -her diabetes is not currently complicated but she remains at a high risk for more acute and chronic complications which include CAD, CVA, CKD, retinopathy, and neuropathy. These are all discussed in detail with her.  The following Lifestyle Medicine recommendations according to  [facility]) were discussed and offered to patient and  she agrees to start the journey:  A. Whole Foods, Plant-based plate comprising of fruits and vegetables, plant-based proteins, whole-grain carbohydrates was discussed in detail with the patient.   A list for source of those nutrients were also provided to the patient.  Patient will use only water or unsweetened tea for hydration. B.  The need to stay away from risky substances including alcohol, smoking; obtaining 7 to 9 hours of restorative sleep, at least 150 minutes of moderate intensity exercise weekly, the importance of healthy social connections,  and stress reduction techniques were discussed. C.  A full color page of  Calorie density of various food groups per pound showing examples of each food groups was provided to the patient.  - Nutritional counseling repeated at each appointment due to patients tendency to fall back in to old habits.  - The patient admits there is a room for improvement in their diet and drink choices. -  Suggestion is made for the patient to avoid simple carbohydrates from their diet including Cakes, Sweet Desserts / Pastries, Ice Cream, Soda (diet and regular), Sweet Tea, Candies, Chips, Cookies, Sweet Pastries, Store Bought Juices, Alcohol in Excess of 1-2 drinks a day, Artificial Sweeteners, Coffee Creamer, and "Sugar-free" Products. This will help patient to have stable blood glucose profile and potentially avoid unintended weight gain.   - I encouraged the patient to switch to unprocessed or minimally processed complex starch and increased protein intake (animal or plant source), fruits, and vegetables.   - Patient is advised to stick to a routine mealtimes to eat 3 meals a day and avoid unnecessary snacks (to snack only to correct hypoglycemia).  - she will be scheduled with Jearld Fenton, RDN, CDE for diabetes education- has an appointment with her today.  - I have  approached her with the following individualized plan to manage her diabetes and patient agrees:   - Based on her stable glycemic profile, she is advised to continue her Metformin 500 mg ER daily with breakfast.  -she is encouraged to continue  monitoring blood glucose at least once daily daily, before breakfast and to call the clinic if she has readings less than 70 or above 300 for 3 tests in a row.    - Adjustment parameters are given to her for hypo and hyperglycemia in writing.  - she will be considered for incretin therapy as appropriate next visit, although given her significant GI symptoms, she may not tolerate.  - Specific targets for  A1c; LDL, HDL, and Triglycerides were discussed with the patient.  2) Blood Pressure /Hypertension:  her blood pressure is controlled to target for her age.   she is advised to continue her current medications including Ramipril 2.5 mg p.o. daily with breakfast.  3) Lipids/Hyperlipidemia:    Review of her recent lipid panel from 12/05/20 showed uncontrolled LDL at 123 .  She is not currently on any lipid lowering medications.  Will recheck lipid panel prior to next visit.  4)  Weight/Diet:  her Body mass index is 30.54 kg/m.  -  clearly complicating her diabetes care.   she is a candidate for weight loss. I discussed with her the fact that loss of 5 - 10% of her  current body weight will have the most impact on her diabetes management.  Exercise, and detailed carbohydrates information provided  -  detailed on discharge instructions.  5) Chronic Care/Health Maintenance: -she is on ACEI/ARB and not on Statin medications and is encouraged to initiate and  continue to follow up with Ophthalmology, Dentist, Podiatrist at least yearly or according to recommendations, and advised to stay away from smoking. I have recommended yearly flu vaccine and pneumonia vaccine at least every 5 years; moderate intensity exercise for up to 150 minutes weekly; and sleep for at  least 7 hours a day.  - she is advised to maintain close follow up with Ephriam Jenkins E for primary care needs, as well as her other providers for optimal and coordinated care.      I spent 46 minutes in the care of the patient today including review of labs from [place], Lipids, Thyroid Function, Hematology (current and previous including abstractions from other facilities); face-to-face time discussing  her blood glucose readings/logs, discussing hypoglycemia and hyperglycemia episodes and symptoms, medications doses, her options of short and long term treatment based on the latest standards of care / guidelines;  discussion about incorporating lifestyle medicine;  and documenting the encounter. Risk reduction counseling performed per USPSTF guidelines to reduce obesity and cardiovascular risk factors.     Please refer to Patient Instructions for Blood Glucose Monitoring and Insulin/Medications Dosing Guide"  in media tab for additional information. Please  also refer to " Patient Self Inventory" in the Media  tab for reviewed elements of pertinent patient history.  Ephriam Jenkins participated in the discussions, expressed understanding, and voiced agreement with the above plans.  All questions were answered to her satisfaction. she is encouraged to contact clinic should she have any questions or concerns prior to her return visit.     Follow up plan: - Return in about 3 months (around 02/12/2022) for Diabetes F/U with A1c in office, Previsit labs.   Rayetta Pigg, [facility] [address] [place], [place] [address] Phone: [phone] Fax: [phone]  11/12/2021, 9:10 AM

## 2021-12-17 LAB — CBC AND DIFFERENTIAL
HCT: 33 — AB (ref 36–46)
Hemoglobin: 9.5 — AB (ref 12.0–16.0)

## 2021-12-17 LAB — BASIC METABOLIC PANEL
BUN: 21 (ref 4–21)
Creatinine: 0.7 (ref 0.5–1.1)
Glucose: 149

## 2021-12-17 LAB — MICROALBUMIN, URINE: Microalb, Ur: 0.9

## 2021-12-17 LAB — HEMOGLOBIN A1C: Hemoglobin A1C: 7.7

## 2021-12-17 LAB — MICROALBUMIN / CREATININE URINE RATIO: Microalb Creat Ratio: 12

## 2021-12-17 LAB — LIPID PANEL
LDL Cholesterol: 110
Triglycerides: 145 (ref 40–160)

## 2021-12-17 LAB — PROTEIN / CREATININE RATIO, URINE: Creatinine, Urine: 75

## 2021-12-17 LAB — COMPREHENSIVE METABOLIC PANEL: eGFR: 89

## 2022-01-03 ENCOUNTER — Telehealth: Payer: Self-pay | Admitting: Nurse Practitioner

## 2022-01-03 NOTE — Telephone Encounter (Signed)
Pt thought that because her pill read OE 584 that she was not taking the same medication. I explained that this was the pill identifier. Pt did not understand and believes she is taking '584mg'$  original release.  I asked her to call the pharmacy. She agrees.

## 2022-01-03 NOTE — Telephone Encounter (Signed)
New message  Pt c/o medication issue:  1. Name of Medication: metFORMIN (GLUCOPHAGE-XR) 500 MG 24 hr tablet  2. How are you currently taking this medication (dosage and times per day)? Once a day in the am   3. Are you having a reaction (difficulty breathing--STAT)? No   4. What is your medication issue? Pharmacy sent her Metformin OE Blood sugar are high am. Pharmacy was not contacted.   Will explain further when the nurse call back

## 2022-02-12 ENCOUNTER — Encounter: Payer: Self-pay | Admitting: Nutrition

## 2022-02-12 ENCOUNTER — Encounter: Payer: Self-pay | Admitting: Nurse Practitioner

## 2022-02-12 ENCOUNTER — Ambulatory Visit (INDEPENDENT_AMBULATORY_CARE_PROVIDER_SITE_OTHER): Payer: Medicare Other | Admitting: Nurse Practitioner

## 2022-02-12 ENCOUNTER — Encounter: Payer: Medicare Other | Attending: Nurse Practitioner | Admitting: Nutrition

## 2022-02-12 VITALS — BP 130/69 | HR 60 | Ht 62.0 in | Wt 171.0 lb

## 2022-02-12 VITALS — Ht 63.0 in | Wt 171.0 lb

## 2022-02-12 DIAGNOSIS — E669 Obesity, unspecified: Secondary | ICD-10-CM | POA: Diagnosis present

## 2022-02-12 DIAGNOSIS — E118 Type 2 diabetes mellitus with unspecified complications: Secondary | ICD-10-CM | POA: Insufficient documentation

## 2022-02-12 DIAGNOSIS — E559 Vitamin D deficiency, unspecified: Secondary | ICD-10-CM | POA: Diagnosis not present

## 2022-02-12 DIAGNOSIS — I10 Essential (primary) hypertension: Secondary | ICD-10-CM | POA: Diagnosis not present

## 2022-02-12 DIAGNOSIS — E782 Mixed hyperlipidemia: Secondary | ICD-10-CM

## 2022-02-12 DIAGNOSIS — Z9884 Bariatric surgery status: Secondary | ICD-10-CM | POA: Diagnosis present

## 2022-02-12 DIAGNOSIS — E119 Type 2 diabetes mellitus without complications: Secondary | ICD-10-CM | POA: Diagnosis not present

## 2022-02-12 NOTE — Progress Notes (Signed)
Endocrinology Follow Up Note       02/12/2022, 9:51 AM   Subjective:    Patient ID: Vickie Ward, female    DOB: Apr 07, 1946.  Vickie Ward is being seen in follow up after being seen in consultation for management of currently uncontrolled symptomatic diabetes requested by  Thea Alken.   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[place])    GERD (gastroesophageal reflux disease)    pt takes OTC meds   Headache(784.0)    Insomnia    Numbness and tingling    in L arm from neck issues   Osteoporosis    Overactive bladder    takes Vesicare   PONV (postoperative nausea and vomiting)    Sleep apnea    sleep study done > 54yr ago;no cpap and since lost weight much improvement   UTI (lower urinary tract infection)    currently taking Cipro   Visual floaters     Past Surgical History:  Procedure Laterality Date   ABDOMINAL HYSTERECTOMY     ANTERIOR CERVICAL DECOMP/DISCECTOMY FUSION N/A 03/18/2013   Procedure: ACDF C4 - C7 3 LEVELS;  Surgeon: DMelina Schools MD;  Location: [facility]  Service: Orthopedics;  Laterality: N/A;   COLONOSCOPY     DILATION AND CURETTAGE OF UTERUS     REPLACEMENT TOTAL KNEE     2019   TONSILLECTOMY     adenoidectomy    Social History   Socioeconomic History   Marital status: Married    Spouse name: Not on file   Number of children: Not on file   Years of education: Not on file   Highest education level: Not on file  Occupational History   Occupation: Retired  Tobacco Use   Smoking status: Never   Smokeless tobacco: Never  Vaping Use   Vaping Use: Never used  Substance and Sexual Activity   Alcohol use: Yes    Alcohol/week: 1.0 - 2.0 standard drink of alcohol    Types: 1 - 2 Glasses of wine per week    Comment: daily   Drug use:  Never   Sexual activity: Not on file  Other Topics Concern   Not on file  Social History Narrative   Not on file   Social Determinants of Health   Financial Resource Strain: Not on file  Food Insecurity: Not on file  Transportation Needs: Not on file  Physical Activity: Not on file  Stress: Not on file  Social Connections: Not on file    Family History  Problem Relation Age of Onset   Heart attack Mother    Hypertension Mother    Diabetes type II Mother    High Cholesterol Mother    Hyperlipidemia Mother    Heart failure Mother    Diabetes type II Father    Lung cancer Father    Hypertension Sister    Vascular Disease Sister    Diabetes type II Sister    COPD Brother    Drug abuse Brother    Drug  abuse Brother    Stomach cancer Brother     Outpatient Encounter Medications as of 02/12/2022  Medication Sig   ALLERGY RELIEF D-12 5-120 MG tablet SMARTSIG:1 Tablet(s) By Mouth Every 12 Hours   amoxicillin (AMOXIL) 500 MG capsule    cholecalciferol (VITAMIN D) 1000 units tablet Take 6,000 Units by mouth daily.   cyanocobalamin (,VITAMIN B-12,) 1000 MCG/ML injection Inject 1,000 mcg into the muscle every 30 (thirty) days.   cyanocobalamin 1000 MCG tablet Take by mouth.   estradiol (ESTRACE) 0.1 MG/GM vaginal cream estradiol 0.01% (0.1 mg/gram) vaginal cream  apply a pea sized AMOUNT VAGINALLY (bladder opening at the vagina) AS DIRECTED NIGHTLY FOR SIX WEEKS, THEN TWICE A WEEK FOR maintenance   glucose blood (ONETOUCH VERIO) test strip 1 each by Other route as needed for other. Use as instructed   Melatonin-Pyridoxine 10-10 MG TBCR Take by mouth.   metFORMIN (GLUCOPHAGE-XR) 500 MG 24 hr tablet Take 1 tablet (500 mg total) by mouth daily with breakfast.   nitroGLYCERIN (NITROSTAT) 0.4 MG SL tablet nitroglycerin 0.4 mg sublingual tablet  TABLET 0.'4MG'$  UNDER THE TONGUE EVERY FIVE MINUTES AS NEEDED FOR CHEST PAIN. DO NOT EXCEED THREE DOSES IN 15 MINUTES   Omega-3 1000 MG CAPS Take  by mouth.   ramipril (ALTACE) 2.5 MG capsule Take 2.5 mg by mouth daily.   zolpidem (AMBIEN) 10 MG tablet Take 5 mg by mouth at bedtime as needed for sleep.   No facility-administered encounter medications on file as of 02/12/2022.    ALLERGIES: Allergies  Allergen Reactions   Cephalexin Other (See Comments) and Rash    Per patient, may have caused itching or swelling Other reaction(s): Other (See Comments) Pt. States of the face Other Reaction: Other reaction Per patient, may have caused itching or swelling    Codeine Nausea And Vomiting   Nitrofurantoin Rash    Other reaction(s): Other (See Comments) Mouth Ulcers    Macrobid [Nitrofurantoin Macrocrystal] Other (See Comments)    Per patient, may have caused itching or swelling   Sulfamethoxazole-Trimethoprim     Other reaction(s): Other (See Comments) Oral thrush    VACCINATION STATUS:  There is no immunization history on file for this patient.  Diabetes She presents for her follow-up diabetic visit. She has type 2 diabetes mellitus. Onset time: Diagnosed at approx age of 63. Her disease course has been stable. There are no hypoglycemic associated symptoms. Associated symptoms include fatigue. Pertinent negatives for diabetes include no weakness. There are no hypoglycemic complications. Symptoms are stable. There are no diabetic complications. Risk factors for coronary artery disease include diabetes mellitus, dyslipidemia, family history, hypertension, stress, sedentary lifestyle and post-menopausal. Current diabetic treatment includes oral agent (monotherapy). She is compliant with treatment most of the time. Her weight is increasing steadily. She is following a generally unhealthy diet. When asked about meal planning, she reported none. She has not had a previous visit with a dietitian. She rarely participates in exercise. Her breakfast blood glucose range is generally >200 mg/dl. (She presents today with her logs showing above  target glycemic profile.  Her previsit A1c on 8/21 was 7.7%, increasing slightly from last visit of 7.5%.  She notes she has been binge eating at night more, been less active.  She admits she has let stress take over her life recently.  ) An ACE inhibitor/angiotensin II receptor blocker is being taken. She does not see a podiatrist.Eye exam is current.     Review of systems  Constitutional: + Minimally  fluctuating body weight, current Body mass index is 31.28 kg/m., + fatigue- says these come in waves, no subjective hyperthermia, no subjective hypothermia Eyes: no blurry vision, no xerophthalmia ENT: no sore throat, no nodules palpated in throat, no dysphagia/odynophagia, no hoarseness Cardiovascular: no chest pain, no shortness of breath, no palpitations, no leg swelling Respiratory: no cough, no shortness of breath Gastrointestinal: + intermittent nausea/vomiting/diarrhea- improved since lowering Metformin Musculoskeletal: no muscle/joint aches Skin: no rashes, no hyperemia Neurological: no tremors, no numbness, no tingling, no dizziness Psychiatric: + depression, + anxiety  Objective:     BP 130/69 (BP Location: Left Arm, Patient Position: Sitting, Cuff Size: Large)   Pulse 60   Ht '5\' 2"'$  (1.575 m)   Wt 171 lb (77.6 kg)   BMI 31.28 kg/m   Wt Readings from Last 3 Encounters:  02/12/22 171 lb (77.6 kg)  02/12/22 171 lb (77.6 kg)  11/12/21 167 lb (75.8 kg)     BP Readings from Last 3 Encounters:  02/12/22 130/69  11/12/21 (!) 152/78  07/31/21 (!) 161/64      Physical Exam- Limited  Constitutional:  Body mass index is 31.28 kg/m. , not in acute distress, normal state of mind Eyes:  EOMI, no exophthalmos Neck: Supple Cardiovascular: RRR, no murmurs, rubs, or gallops, no edema Respiratory: Adequate breathing efforts, no crackles, rales, rhonchi, or wheezing Musculoskeletal: no gross deformities, strength intact in all four extremities, no gross restriction of joint  movements Skin:  no rashes, no hyperemia Neurological: no tremor with outstretched hands   Diabetic Foot Exam - Simple   No data filed     CMP ( most recent) CMP     Component Value Date/Time   BUN 21 12/17/2021 0000   CREATININE 0.7 12/17/2021 0000     Diabetic Labs (most recent): Lab Results  Component Value Date   HGBA1C 7.7 12/17/2021   HGBA1C 7.5 11/12/2021   HGBA1C 7.4 06/12/2021   MICROALBUR 0.9 12/17/2021   MICROALBUR 10 07/31/2021     Lipid Panel ( most recent) Lipid Panel     Component Value Date/Time   TRIG 145 12/17/2021 0000   LDLCALC 110 12/17/2021 0000      Lab Results  Component Value Date   TSH 1.510 07/17/2021   FREET4 1.05 07/17/2021           Assessment & Plan:   1) Type 2 diabetes mellitus without complication, without long-term current use of insulin ([place])  She presents today with her logs showing above target glycemic profile.  Her previsit A1c on 8/21 was 7.7%, increasing slightly from last visit of 7.5%.  She notes she has been binge eating at night more, been less active.  She admits she has let stress take over her life recently.    - Vickie Ward has currently uncontrolled symptomatic type 2 DM since 76 years of age.   -Recent labs reviewed.  - I had a long discussion with her about the progressive nature of diabetes and the pathology behind its complications. -her diabetes is not currently complicated but she remains at a high risk for more acute and chronic complications which include CAD, CVA, CKD, retinopathy, and neuropathy. These are all discussed in detail with her.  The following Lifestyle Medicine recommendations according to [facility]) were discussed and offered to patient and she agrees to start the journey:  A. Whole Foods, Plant-based plate comprising of fruits and vegetables, plant-based proteins, whole-grain carbohydrates was discussed in detail  with the patient.   A list  for source of those nutrients were also provided to the patient.  Patient will use only water or unsweetened tea for hydration. B.  The need to stay away from risky substances including alcohol, smoking; obtaining 7 to 9 hours of restorative sleep, at least 150 minutes of moderate intensity exercise weekly, the importance of healthy social connections,  and stress reduction techniques were discussed. C.  A full color page of  Calorie density of various food groups per pound showing examples of each food groups was provided to the patient.  - Nutritional counseling repeated at each appointment due to patients tendency to fall back in to old habits.  - The patient admits there is a room for improvement in their diet and drink choices. -  Suggestion is made for the patient to avoid simple carbohydrates from their diet including Cakes, Sweet Desserts / Pastries, Ice Cream, Soda (diet and regular), Sweet Tea, Candies, Chips, Cookies, Sweet Pastries, Store Bought Juices, Alcohol in Excess of 1-2 drinks a day, Artificial Sweeteners, Coffee Creamer, and "Sugar-free" Products. This will help patient to have stable blood glucose profile and potentially avoid unintended weight gain.   - I encouraged the patient to switch to unprocessed or minimally processed complex starch and increased protein intake (animal or plant source), fruits, and vegetables.   - Patient is advised to stick to a routine mealtimes to eat 3 meals a day and avoid unnecessary snacks (to snack only to correct hypoglycemia).  - she is being followed by Jearld Fenton, RDN, CDE for diabetes education- had an appointment with her today.  - I have approached her with the following individualized plan to manage her diabetes and patient agrees:   -Although her glycemic profile is worse, she is advised to continue her Metformin 500 mg ER daily with breakfast.  Her main goal is to come off medications altogether in the future.  We discussed  strategies to do this today with lifestyle modifications and mental health focus.  -she is encouraged to continue monitoring blood glucose at least once daily daily, before breakfast and to call the clinic if she has readings less than 70 or above 300 for 3 tests in a row.    - Adjustment parameters are given to her for hypo and hyperglycemia in writing.  - she will be considered for incretin therapy as appropriate next visit, although given her significant GI symptoms, she may not tolerate.  - Specific targets for  A1c; LDL, HDL, and Triglycerides were discussed with the patient.  2) Blood Pressure /Hypertension:  her blood pressure is controlled to target for her age.   she is advised to continue her current medications including Ramipril 2.5 mg p.o. daily with breakfast.  3) Lipids/Hyperlipidemia:    Review of her recent lipid panel from 12/17/21 showed uncontrolled LDL at 110 .  She is not currently on any lipid lowering medications.    4)  Weight/Diet:  her Body mass index is 31.28 kg/m.  -  clearly complicating her diabetes care.   she is a candidate for weight loss. I discussed with her the fact that loss of 5 - 10% of her  current body weight will have the most impact on her diabetes management.  Exercise, and detailed carbohydrates information provided  -  detailed on discharge instructions.  5) Chronic Care/Health Maintenance: -she is on ACEI/ARB and not on Statin medications and is encouraged to initiate and continue to follow up with  Ophthalmology, Dentist, Podiatrist at least yearly or according to recommendations, and advised to stay away from smoking. I have recommended yearly flu vaccine and pneumonia vaccine at least every 5 years; moderate intensity exercise for up to 150 minutes weekly; and sleep for at least 7 hours a day.  - she is advised to maintain close follow up with Vickie Ward for primary care needs, as well as her other providers for optimal and coordinated  care.      I spent 41 minutes in the care of the patient today including review of labs from [facility], Lipids, Thyroid Function, Hematology (current and previous including abstractions from other facilities); face-to-face time discussing  her blood glucose readings/logs, discussing hypoglycemia and hyperglycemia episodes and symptoms, medications doses, her options of short and long term treatment based on the latest standards of care / guidelines;  discussion about incorporating lifestyle medicine;  and documenting the encounter. Risk reduction counseling performed per USPSTF guidelines to reduce obesity and cardiovascular risk factors.     Please refer to Patient Instructions for Blood Glucose Monitoring and Insulin/Medications Dosing Guide"  in media tab for additional information. Please  also refer to " Patient Self Inventory" in the Media  tab for reviewed elements of pertinent patient history.  Vickie Ward participated in the discussions, expressed understanding, and voiced agreement with the above plans.  All questions were answered to her satisfaction. she is encouraged to contact clinic should she have any questions or concerns prior to her return visit.     Follow up plan: - Return in about 3 months (around 05/15/2022) for Diabetes F/U with A1c in office, No previsit labs, Bring meter and logs.   Vickie Ward, [facility] [address] [place], [place] [address] Phone: [phone] Fax: [phone]  02/12/2022, 9:51 AM

## 2022-02-12 NOTE — Patient Instructions (Addendum)
Goal  Eat three meals per day at times discussed. Increase fresh fruits and vegetables. Drink only water. FBS less than 150 Don't eat after 7 pm Talk to MD about treatment for your anxiety and depression. Check BS twice  a day; before breakfast and before bedtime.

## 2022-02-12 NOTE — Progress Notes (Signed)
Medical Nutrition Therapy DM Follow up Appointment Start time:  0830  Appointment End time: 0845  Primary concerns today: Dm Type 2  Referral diagnosis: E11/9 Preferred learning style: Hear and See  Learning readiness: Changes in progress   NUTRITION ASSESSMENT  76 yr old female with history of gastric bypass in 2018.  Gained 4 lbs. Under a lot of stress. Her sister is in a nursing home. Only taking 500 mg of Metformin a day now.  FBS 200's. Has been eating later at night.  Is in some pain. Having some  heart issues with left arm pain..  To see Rayetta Pigg today. Drinking 1 premeir shakes daily.  Has been more tired lately. Complains of weak spell and bad hot flashes and then is exhausted. Last A1C was 7.7% in August 2023.  She notes she is under a lot of stress dealing with her sister and family members. Notes she has a lot of anxiety and depression that she is willing to get treated for when she sees her MD next time.  Diet is inconsistent with eating 1-2 meals per day, snacking late at night and not eating well balanced meals.   Anthropometrics  Wt Readings from Last 3 Encounters:  11/12/21 167 lb (75.8 kg)  11/12/21 167 lb (75.8 kg)  09/26/21 167 lb (75.8 kg)   Ht Readings from Last 3 Encounters:  11/12/21 '5\' 2"'$  (1.575 m)  11/12/21 '5\' 2"'$  (1.575 m)  09/26/21 '5\' 5"'$  (1.651 m)   There is no height or weight on file to calculate BMI. '@BMIFA'$ @ Facility age limit for growth %iles is 20 years. Facility age limit for growth %iles is 20 years.    Clinical Medical Hx: See chart Medications: Metformin 500 mg ER once a day;  Labs:  Lab Results  Component Value Date   HGBA1C 7.7 12/17/2021     Notable Signs/Symptoms: Increased urination. Fatigue  Lifestyle & Dietary Hx Lives with her husband. Had gastric bypass 2010 and lost 90 lbs. Initial weight 245 lbs.   Estimated daily fluid intake: 100  oz Supplements: Vit B12,  Sleep: 6-8 hrs with ambien Stress /  self-care: none Current average weekly physical activity: started walking with her husband.   24-Hr Dietary Recall First Meal: 11 am  Corn flakes, 2% lactose free.or  2 boiled eggs, Premiere protein shake Second Meal: skips if she is busy. Dinner: various foods. Not cooking a lot right now due to having to go to nursing home to be with her sister. Snack: usually snacks on various things after supper. Drinks: some soda, water,  Beverages:Diet pepsi 16 oz, ice,   Estimated Energy Needs Calories: 1200 Carbohydrate: 135g Protein: 60-80g Fat: 32g   NUTRITION DIAGNOSIS  NB-1.1 Food and nutrition-related knowledge deficit As related to Diabetes .  As evidenced by A1C 7.4%.   NUTRITION INTERVENTION  Nutrition education (E-1) on the following topics:  Nutrition and Diabetes education provided on My Plate, CHO counting, meal planning, portion sizes, timing of meals, avoiding snacks between meals unless having a low blood sugar, target ranges for A1C and blood sugars, signs/symptoms and treatment of hyper/hypoglycemia, monitoring blood sugars, taking medications as prescribed, benefits of exercising 30 minutes per day and prevention of complications of DM.  Bariatric nutrition, meal planning, chewing, not drinking liquids with meals and bring mindful when eating. Vitamin and mineral needs.  Handouts Provided Include  Nutrition and Diabetes education provided on My Plate, CHO counting, meal planning, portion sizes, timing of meals, avoiding snacks between  meals unless having a low blood sugar, target ranges for A1C and blood sugars, signs/symptoms and treatment of hyper/hypoglycemia, monitoring blood sugars, taking medications as prescribed, benefits of exercising 30 minutes per day and prevention of complications of DM. Lifestyle Medicine - Whole Food, Plant Predominant Nutrition is highly recommended: Eat Plenty of vegetables, Mushrooms, fruits, Legumes, Whole Grains, Nuts, seeds in lieu of  processed meats, processed snacks/pastries red meat, poultry, eggs.    -It is better to avoid simple carbohydrates including: Cakes, Sweet Desserts, Ice Cream, Soda (diet and regular), Sweet Tea, Candies, Chips, Cookies, Store Bought Juices, Alcohol in Excess of  1-2 drinks a day, Lemonade,  Artificial Sweeteners, Doughnuts, Coffee Creamers, "Sugar-free" Products, etc, etc.  This is not a complete list.....  Exercise: If you are able: 30 -60 minutes a day ,4 days a week, or 150 minutes a week.  The longer the better.  Combine stretch, strength, and aerobic activities.  If you were told in the past that you have high risk for cardiovascular diseases, you may seek evaluation by your heart doctor prior to initiating moderate to intense exercise programs.   Learning Style & Readiness for Change Teaching method utilized: Visual & Auditory  Demonstrated degree of understanding via: Teach Back  Barriers to learning/adherence to lifestyle change: current stress, anxiety and depression.  Goals Established by Pt Goal  Eat three meals per day at times discussed. Increase fresh fruits and vegetables. Drink only water. FBS less than 150 Don't eat after 7 pm Talk to MD about treatment for your anxiety and depression. Check BS twice  a day; before breakfast and before bedtime.  MONITORING & EVALUATION Dietary intake, weekly physical activity, and blood sugars and diet in 3 month.  Next Steps  Patient is to work on following whole food plant based meals.

## 2022-02-18 ENCOUNTER — Telehealth: Payer: Self-pay | Admitting: Nurse Practitioner

## 2022-02-18 NOTE — Telephone Encounter (Signed)
FYI - medication dosage change. From 500 mg to 750 mg of Metformin. Patient would like you input.

## 2022-02-18 NOTE — Telephone Encounter (Signed)
New message    Pt c/o medication issue:  1. Name of Medication: metFORMIN (GLUCOPHAGE-XR) 500 MG 24 hr tablet  2. How are you currently taking this medication (dosage and times per day)? One time a day    3. Are you having a reaction (difficulty breathing--STAT)? No   4. What is your medication issue? Recently discharge from hospital  - Metformin dosage changed to  750 mg once a day in the am

## 2022-02-19 NOTE — Telephone Encounter (Signed)
She should be ok with increasing to 750 mg as long as she tolerates it.  Her kidneys looked good in August.  It should help her over this hump she has been having recently and get back on track.

## 2022-02-19 NOTE — Telephone Encounter (Signed)
Patient was called and given Whitney Reardon's recommendation.

## 2022-05-14 ENCOUNTER — Encounter: Payer: Self-pay | Admitting: Nutrition

## 2022-05-14 ENCOUNTER — Encounter: Payer: Medicare Other | Attending: Nurse Practitioner | Admitting: Nutrition

## 2022-05-14 ENCOUNTER — Encounter: Payer: Self-pay | Admitting: Nurse Practitioner

## 2022-05-14 ENCOUNTER — Ambulatory Visit (INDEPENDENT_AMBULATORY_CARE_PROVIDER_SITE_OTHER): Payer: Medicare Other | Admitting: Nurse Practitioner

## 2022-05-14 VITALS — BP 111/68 | HR 69 | Ht 62.0 in | Wt 168.6 lb

## 2022-05-14 VITALS — Ht 63.0 in | Wt 168.5 lb

## 2022-05-14 DIAGNOSIS — E669 Obesity, unspecified: Secondary | ICD-10-CM | POA: Diagnosis present

## 2022-05-14 DIAGNOSIS — E118 Type 2 diabetes mellitus with unspecified complications: Secondary | ICD-10-CM | POA: Diagnosis not present

## 2022-05-14 DIAGNOSIS — E559 Vitamin D deficiency, unspecified: Secondary | ICD-10-CM | POA: Diagnosis not present

## 2022-05-14 DIAGNOSIS — I10 Essential (primary) hypertension: Secondary | ICD-10-CM

## 2022-05-14 DIAGNOSIS — Z9884 Bariatric surgery status: Secondary | ICD-10-CM | POA: Diagnosis present

## 2022-05-14 DIAGNOSIS — E119 Type 2 diabetes mellitus without complications: Secondary | ICD-10-CM

## 2022-05-14 DIAGNOSIS — E782 Mixed hyperlipidemia: Secondary | ICD-10-CM

## 2022-05-14 LAB — POCT GLYCOSYLATED HEMOGLOBIN (HGB A1C): Hemoglobin A1C: 7.2 % — AB (ref 4.0–5.6)

## 2022-05-14 MED ORDER — METFORMIN HCL ER 750 MG PO TB24: 750.0000 mg | ORAL_TABLET | Freq: Every day | ORAL | 1 refills | Status: DC

## 2022-05-14 NOTE — Progress Notes (Signed)
Medical Nutrition Therapy DM Follow up Appointment Start time:  0930  Appointment End time: 1000 am  Primary concerns today: Dm Type 2  Referral diagnosis: E11/9 Preferred learning style: Hear and See  Learning readiness: Changes in progress   NUTRITION ASSESSMENT  77 yr old female with history of gastric bypass in 2018.  Lost her sister in December and having to deal with her possessions and her estate. Lost 2-1/2 lbs. Has a lot of stress in her life right now. Admits to eating late at night and that is causing her BS in the 200's in am. Currently on 750 mg of Metformin. A1C down to 7.2% Meal consistency is still an on going issue that  needs improvement. Diet is still higher in processed meats of bacon, biscuits at times. Drinking protein shakes but advised to focus on eating more plant based foods and protein from dried beans, peas and lentils.  Diet is inconsistent with eating 1-2 meals per day, snacking late at night and not eating well balanced meals.   Anthropometrics  Wt Readings from Last 3 Encounters:  05/14/22 168 lb 9.6 oz (76.5 kg)  02/12/22 171 lb (77.6 kg)  02/12/22 171 lb (77.6 kg)   Ht Readings from Last 3 Encounters:  05/14/22 '5\' 2"'$  (1.575 m)  02/12/22 '5\' 2"'$  (1.575 m)  02/12/22 '5\' 3"'$  (1.6 m)   There is no height or weight on file to calculate BMI. '@BMIFA'$ @ Facility age limit for growth %iles is 20 years. Facility age limit for growth %iles is 20 years.    Clinical Medical Hx: See chart Medications: Metformin 500 mg ER once a day;  Labs:  Lab Results  Component Value Date   HGBA1C 7.2 (A) 05/14/2022     Notable Signs/Symptoms: Increased urination. Fatigue  Lifestyle & Dietary Hx Lives with her husband. Had gastric bypass 2010 and lost 90 lbs. Initial weight 245 lbs.   Estimated daily fluid intake: 100  oz Supplements: Vit B12,  Sleep: 6-8 hrs with ambien Stress / self-care: none Current average weekly physical activity: started walking with her  husband.   24-Hr Dietary Recall First Meal: 11 am Corn Flakes, 2 % Lactose free pr 1 egg, bacon and toast, coffee-half and half. Lunch PB pretzels- 6-8, Protein shake Dinner: [employer], water Misc snacks at night; pretzels, snack food.   Estimated Energy Needs Calories: 1200 Carbohydrate: 135g Protein: 60-80g Fat: 32g   NUTRITION DIAGNOSIS  NB-1.1 Food and nutrition-related knowledge deficit As related to Diabetes .  As evidenced by A1C 7.4%.   NUTRITION INTERVENTION  Nutrition education (E-1) on the following topics:  Nutrition and Diabetes education provided on My Plate, CHO counting, meal planning, portion sizes, timing of meals, avoiding snacks between meals unless having a low blood sugar, target ranges for A1C and blood sugars, signs/symptoms and treatment of hyper/hypoglycemia, monitoring blood sugars, taking medications as prescribed, benefits of exercising 30 minutes per day and prevention of complications of DM.  Bariatric nutrition, meal planning, chewing, not drinking liquids with meals and bring mindful when eating. Vitamin and mineral needs.  Handouts Provided Include  Nutrition and Diabetes education provided on My Plate, CHO counting, meal planning, portion sizes, timing of meals, avoiding snacks between meals unless having a low blood sugar, target ranges for A1C and blood sugars, signs/symptoms and treatment of hyper/hypoglycemia, monitoring blood sugars, taking medications as prescribed, benefits of exercising 30 minutes per day and prevention of complications of DM. Lifestyle Medicine - Whole Food, Plant Predominant Nutrition is  highly recommended: Eat Plenty of vegetables, Mushrooms, fruits, Legumes, Whole Grains, Nuts, seeds in lieu of processed meats, processed snacks/pastries red meat, poultry, eggs.    -It is better to avoid simple carbohydrates including: Cakes, Sweet Desserts, Ice Cream, Soda (diet and regular), Sweet Tea, Candies, Chips, Cookies, Store  Bought Juices, Alcohol in Excess of  1-2 drinks a day, Lemonade,  Artificial Sweeteners, Doughnuts, Coffee Creamers, "Sugar-free" Products, etc, etc.  This is not a complete list.....  Exercise: If you are able: 30 -60 minutes a day ,4 days a week, or 150 minutes a week.  The longer the better.  Combine stretch, strength, and aerobic activities.  If you were told in the past that you have high risk for cardiovascular diseases, you may seek evaluation by your heart doctor prior to initiating moderate to intense exercise programs.   Learning Style & Readiness for Change Teaching method utilized: Visual & Auditory  Demonstrated degree of understanding via: Teach Back  Barriers to learning/adherence to lifestyle change: current stress, anxiety and depression.  Goals Established by Pt Goal Goals  Don't eat past 7 pm Increase fresh fruits, vegetables and whole grains. Cut out peanut butter pretzels, protein shakes and get protein from more dried beans at meals Avoid snack between meals unless veggies. Drink 4 bottles of water per day Get Am blood sugars less than 150 mg/dl.  MONITORING & EVALUATION Dietary intake, weekly physical activity, and blood sugars and diet in 3 month.  Next Steps  Patient is to work on following whole food plant based meals.

## 2022-05-14 NOTE — Patient Instructions (Signed)
Goals  Don't eat past 7 pm Increase fresh fruits, vegetables and whole grains. Cut out peanut butter pretzels, protein shakes and get protein from more dried beans at meals Avoid snack between meals unless veggies. Drink 4 bottles of water per day Get Am blood sugars less than 150 mg/dl.

## 2022-05-14 NOTE — Progress Notes (Signed)
Endocrinology Follow Up Note       05/14/2022, 9:25 AM   Subjective:    Patient ID: Vickie Ward, female    DOB: 1946-01-18.  Vickie Ward is being seen in follow up after being seen in consultation for management of currently uncontrolled symptomatic diabetes requested by  Thea Alken.   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[place])    GERD (gastroesophageal reflux disease)    pt takes OTC meds   Headache(784.0)    Insomnia    Numbness and tingling    in L arm from neck issues   Osteoporosis    Overactive bladder    takes Vesicare   PONV (postoperative nausea and vomiting)    Sleep apnea    sleep study done > 76yr ago;no cpap and since lost weight much improvement   UTI (lower urinary tract infection)    currently taking Cipro   Visual floaters     Past Surgical History:  Procedure Laterality Date   ABDOMINAL HYSTERECTOMY     ANTERIOR CERVICAL DECOMP/DISCECTOMY FUSION N/A 03/18/2013   Procedure: ACDF C4 - C7 3 LEVELS;  Surgeon: DMelina Schools MD;  [place] [facility]  Service: Orthopedics;  Laterality: N/A;   COLONOSCOPY     DILATION AND CURETTAGE OF UTERUS     REPLACEMENT TOTAL KNEE     2019   TONSILLECTOMY     adenoidectomy    Social History   Socioeconomic History   Marital status: Married    Spouse name: Not on file   Number of children: Not on file   Years of education: Not on file   Highest education level: Not on file  Occupational History   Occupation: Retired  Tobacco Use   Smoking status: Never   Smokeless tobacco: Never  Vaping Use   Vaping Use: Never used  Substance and Sexual Activity   Alcohol use: Yes    Alcohol/week: 1.0 - 2.0 standard drink of alcohol    Types: 1 - 2 Glasses of wine per week    Comment: daily   Drug use:  Never   Sexual activity: Not on file  Other Topics Concern   Not on file  Social History Narrative   Not on file   Social Determinants of Health   Financial Resource Strain: Not on file  Food Insecurity: Not on file  Transportation Needs: Not on file  Physical Activity: Not on file  Stress: Not on file  Social Connections: Not on file    Family History  Problem Relation Age of Onset   Heart attack Mother    Hypertension Mother    Diabetes type II Mother    High Cholesterol Mother    Hyperlipidemia Mother    Heart failure Mother    Diabetes type II Father    Lung cancer Father    Hypertension Sister    Vascular Disease Sister    Diabetes type II Sister    COPD Brother    Drug abuse Brother    Drug  abuse Brother    Stomach cancer Brother     Outpatient Encounter Medications as of 05/14/2022  Medication Sig   cholecalciferol (VITAMIN D) 1000 units tablet Take 6,000 Units by mouth daily.   cyanocobalamin (,VITAMIN B-12,) 1000 MCG/ML injection Inject 1,000 mcg into the muscle every 30 (thirty) days.   cyanocobalamin 1000 MCG tablet Take by mouth.   ELIQUIS 5 MG TABS tablet Take 5 mg by mouth 2 (two) times daily.   estradiol (ESTRACE) 0.1 MG/GM vaginal cream estradiol 0.01% (0.1 mg/gram) vaginal cream  apply a pea sized AMOUNT VAGINALLY (bladder opening at the vagina) AS DIRECTED NIGHTLY FOR SIX WEEKS, THEN TWICE A WEEK FOR maintenance   glucose blood (ONETOUCH VERIO) test strip 1 each by Other route as needed for other. Use as instructed   Melatonin-Pyridoxine 10-10 MG TBCR Take by mouth.   nitroGLYCERIN (NITROSTAT) 0.4 MG SL tablet nitroglycerin 0.4 mg sublingual tablet  TABLET 0.'4MG'$  UNDER THE TONGUE EVERY FIVE MINUTES AS NEEDED FOR CHEST PAIN. DO NOT EXCEED THREE DOSES IN 15 MINUTES   Omega-3 1000 MG CAPS Take by mouth.   ramipril (ALTACE) 2.5 MG capsule Take 2.5 mg by mouth daily.   zolpidem (AMBIEN) 10 MG tablet Take 5 mg by mouth at bedtime as needed for sleep.    [DISCONTINUED] metFORMIN (GLUCOPHAGE-XR) 500 MG 24 hr tablet Take 1 tablet (500 mg total) by mouth daily with breakfast.   metFORMIN (GLUCOPHAGE-XR) 750 MG 24 hr tablet Take 1 tablet (750 mg total) by mouth daily with breakfast.   [DISCONTINUED] ALLERGY RELIEF D-12 5-120 MG tablet SMARTSIG:1 Tablet(s) By Mouth Every 12 Hours (Patient not taking: Reported on 05/14/2022)   [DISCONTINUED] amoxicillin (AMOXIL) 500 MG capsule  (Patient not taking: Reported on 05/14/2022)   No facility-administered encounter medications on file as of 05/14/2022.    ALLERGIES: Allergies  Allergen Reactions   Cephalexin Other (See Comments) and Rash    Per patient, may have caused itching or swelling Other reaction(s): Other (See Comments) Pt. States of the face Other Reaction: Other reaction Per patient, may have caused itching or swelling    Codeine Nausea And Vomiting   Nitrofurantoin Rash    Other reaction(s): Other (See Comments) Mouth Ulcers    Macrobid [Nitrofurantoin Macrocrystal] Other (See Comments)    Per patient, may have caused itching or swelling   Sulfamethoxazole-Trimethoprim     Other reaction(s): Other (See Comments) Oral thrush    VACCINATION STATUS:  There is no immunization history on file for this patient.  Diabetes She presents for her follow-up diabetic visit. She has type 2 diabetes mellitus. Onset time: Diagnosed at approx age of 25. Her disease course has been improving. There are no hypoglycemic associated symptoms. Associated symptoms include fatigue. Pertinent negatives for diabetes include no weakness. There are no hypoglycemic complications. Symptoms are stable. There are no diabetic complications. Risk factors for coronary artery disease include diabetes mellitus, dyslipidemia, family history, hypertension, stress, sedentary lifestyle and post-menopausal. Current diabetic treatment includes oral agent (monotherapy). She is compliant with treatment most of the time. Her weight  is increasing steadily. She is following a generally unhealthy diet. When asked about meal planning, she reported none. She has not had a previous visit with a dietitian. She rarely participates in exercise. Her breakfast blood glucose range is generally >200 mg/dl. (She presents today with her meter, no logs, showing above target glycemic profile.  Her POCT A1c today is 7.2%, improving from last visit of 7.7%.  She notes she went to  the hospital since last visit for Afib and is now on Eliquis but wonders if she still needs to take it.  She also notes she ran out of her Ambien for a short period of time and her sleep schedule was off as a result which affected her diabetes management.) An ACE inhibitor/angiotensin II receptor blocker is being taken. She does not see a podiatrist.Eye exam is current.     Review of systems  Constitutional: + Minimally fluctuating body weight, current Body mass index is 30.84 kg/m., + fatigue- says these come in waves, no subjective hyperthermia, no subjective hypothermia Eyes: no blurry vision, no xerophthalmia ENT: no sore throat, no nodules palpated in throat, no dysphagia/odynophagia, no hoarseness Cardiovascular: no chest pain, no shortness of breath, no palpitations, no leg swelling Respiratory: no cough, no shortness of breath Gastrointestinal: + intermittent nausea/vomiting/diarrhea Musculoskeletal: no muscle/joint aches Skin: no rashes, no hyperemia Neurological: no tremors, no numbness, no tingling, + intermittent dizziness Psychiatric: + depression, + anxiety, + insomnia  Objective:     BP 111/68 (BP Location: Left Arm, Patient Position: Sitting, Cuff Size: Normal)   Pulse 69   Ht '5\' 2"'$  (1.575 m)   Wt 168 lb 9.6 oz (76.5 kg)   BMI 30.84 kg/m   Wt Readings from Last 3 Encounters:  05/14/22 168 lb 9.6 oz (76.5 kg)  02/12/22 171 lb (77.6 kg)  02/12/22 171 lb (77.6 kg)     BP Readings from Last 3 Encounters:  05/14/22 111/68  02/12/22 130/69   11/12/21 (!) 152/78      Physical Exam- Limited  Constitutional:  Body mass index is 30.84 kg/m. , not in acute distress, normal state of mind Eyes:  EOMI, no exophthalmos Musculoskeletal: no gross deformities, strength intact in all four extremities, no gross restriction of joint movements Skin:  no rashes, no hyperemia Neurological: no tremor with outstretched hands   Diabetic Foot Exam - Simple   No data filed     CMP ( most recent) CMP     Component Value Date/Time   BUN 21 12/17/2021 0000   CREATININE 0.7 12/17/2021 0000     Diabetic Labs (most recent): Lab Results  Component Value Date   HGBA1C 7.2 (A) 05/14/2022   HGBA1C 7.7 12/17/2021   HGBA1C 7.5 11/12/2021   MICROALBUR 0.9 12/17/2021   MICROALBUR 10 07/31/2021     Lipid Panel ( most recent) Lipid Panel     Component Value Date/Time   TRIG 145 12/17/2021 0000   LDLCALC 110 12/17/2021 0000      Lab Results  Component Value Date   TSH 1.510 07/17/2021   FREET4 1.05 07/17/2021           Assessment & Plan:   1) Type 2 diabetes mellitus without complication, without long-term current use of insulin ([place])  She presents today with her meter, no logs, showing above target glycemic profile.  Her POCT A1c today is 7.2%, improving from last visit of 7.7%.  She notes she went to the hospital since last visit for Afib and is now on Eliquis but wonders if she still needs to take it.  She also notes she ran out of her Ambien for a short period of time and her sleep schedule was off as a result which affected her diabetes management.  - Vickie Ward has currently uncontrolled symptomatic type 2 DM since 77 years of age.   -Recent labs reviewed.  - I had a long discussion with her about the  progressive nature of diabetes and the pathology behind its complications. -her diabetes is not currently complicated but she remains at a high risk for more acute and chronic complications which include CAD, CVA,  CKD, retinopathy, and neuropathy. These are all discussed in detail with her.  The following Lifestyle Medicine recommendations according to [facility]) were discussed and offered to patient and she agrees to start the journey:  A. Whole Foods, Plant-based plate comprising of fruits and vegetables, plant-based proteins, whole-grain carbohydrates was discussed in detail with the patient.   A list for source of those nutrients were also provided to the patient.  Patient will use only water or unsweetened tea for hydration. B.  The need to stay away from risky substances including alcohol, smoking; obtaining 7 to 9 hours of restorative sleep, at least 150 minutes of moderate intensity exercise weekly, the importance of healthy social connections,  and stress reduction techniques were discussed. C.  A full color page of  Calorie density of various food groups per pound showing examples of each food groups was provided to the patient.  - Nutritional counseling repeated at each appointment due to patients tendency to fall back in to old habits.  - The patient admits there is a room for improvement in their diet and drink choices. -  Suggestion is made for the patient to avoid simple carbohydrates from their diet including Cakes, Sweet Desserts / Pastries, Ice Cream, Soda (diet and regular), Sweet Tea, Candies, Chips, Cookies, Sweet Pastries, Store Bought Juices, Alcohol in Excess of 1-2 drinks a day, Artificial Sweeteners, Coffee Creamer, and "Sugar-free" Products. This will help patient to have stable blood glucose profile and potentially avoid unintended weight gain.   - I encouraged the patient to switch to unprocessed or minimally processed complex starch and increased protein intake (animal or plant source), fruits, and vegetables.   - Patient is advised to stick to a routine mealtimes to eat 3 meals a day and avoid unnecessary snacks (to snack only to correct  hypoglycemia).  - she is being followed by Jearld Fenton, RDN, CDE for diabetes education- had an appointment with her today.  - I have approached her with the following individualized plan to manage her diabetes and patient agrees:   -She is advised to continue her Metformin 750 mg ER po daily with breakfast.  -she is encouraged to continue monitoring blood glucose at least once daily daily, before breakfast and to call the clinic if she has readings less than 70 or above 300 for 3 tests in a row.    - Adjustment parameters are given to her for hypo and hyperglycemia in writing.  - she will be considered for incretin therapy as appropriate next visit, although given her significant GI symptoms, she may not tolerate.  - Specific targets for  A1c; LDL, HDL, and Triglycerides were discussed with the patient.  2) Blood Pressure /Hypertension:  her blood pressure is controlled to target for her age.   she is advised to continue her current medications including Ramipril 2.5 mg p.o. daily with breakfast.  3) Lipids/Hyperlipidemia:    Review of her recent lipid panel from 12/17/21 showed uncontrolled LDL at 110 .  She is not currently on any lipid lowering medications.    4)  Weight/Diet:  her Body mass index is 30.84 kg/m.  -  clearly complicating her diabetes care.   she is a candidate for weight loss. I discussed with her the fact that loss  of 5 - 10% of her  current body weight will have the most impact on her diabetes management.  Exercise, and detailed carbohydrates information provided  -  detailed on discharge instructions.  5) Chronic Care/Health Maintenance: -she is on ACEI/ARB and not on Statin medications and is encouraged to initiate and continue to follow up with Ophthalmology, Dentist, Podiatrist at least yearly or according to recommendations, and advised to stay away from smoking. I have recommended yearly flu vaccine and pneumonia vaccine at least every 5 years; moderate  intensity exercise for up to 150 minutes weekly; and sleep for at least 7 hours a day.  - she is advised to maintain close follow up with Vickie Ward E for primary care needs, as well as her other providers for optimal and coordinated care.      I spent 31 minutes in the care of the patient today including review of labs from [place], Lipids, Thyroid Function, Hematology (current and previous including abstractions from other facilities); face-to-face time discussing  her blood glucose readings/logs, discussing hypoglycemia and hyperglycemia episodes and symptoms, medications doses, her options of short and long term treatment based on the latest standards of care / guidelines;  discussion about incorporating lifestyle medicine;  and documenting the encounter. Risk reduction counseling performed per USPSTF guidelines to reduce obesity and cardiovascular risk factors.     Please refer to Patient Instructions for Blood Glucose Monitoring and Insulin/Medications Dosing Guide"  in media tab for additional information. Please  also refer to " Patient Self Inventory" in the Media  tab for reviewed elements of pertinent patient history.  Vickie Ward participated in the discussions, expressed understanding, and voiced agreement with the above plans.  All questions were answered to her satisfaction. she is encouraged to contact clinic should she have any questions or concerns prior to her return visit.     Follow up plan: - Return in about 4 months (around 09/12/2022) for Diabetes F/U with A1c in office, Previsit labs, Bring meter and logs.   Rayetta Pigg, [facility] [address] [place], [place] [address] Phone: [phone] Fax: [phone]  05/14/2022, 9:25 AM

## 2022-09-12 ENCOUNTER — Ambulatory Visit (INDEPENDENT_AMBULATORY_CARE_PROVIDER_SITE_OTHER): Payer: Medicare Other | Admitting: Nurse Practitioner

## 2022-09-12 ENCOUNTER — Encounter: Payer: Self-pay | Admitting: Nurse Practitioner

## 2022-09-12 VITALS — BP 138/80 | HR 59 | Ht 62.0 in | Wt 171.6 lb

## 2022-09-12 DIAGNOSIS — E559 Vitamin D deficiency, unspecified: Secondary | ICD-10-CM | POA: Diagnosis not present

## 2022-09-12 DIAGNOSIS — E782 Mixed hyperlipidemia: Secondary | ICD-10-CM | POA: Diagnosis not present

## 2022-09-12 DIAGNOSIS — Z7984 Long term (current) use of oral hypoglycemic drugs: Secondary | ICD-10-CM

## 2022-09-12 DIAGNOSIS — I10 Essential (primary) hypertension: Secondary | ICD-10-CM

## 2022-09-12 DIAGNOSIS — E119 Type 2 diabetes mellitus without complications: Secondary | ICD-10-CM

## 2022-09-12 LAB — POCT GLYCOSYLATED HEMOGLOBIN (HGB A1C): Hemoglobin A1C: 7.5 % — AB (ref 4.0–5.6)

## 2022-09-12 MED ORDER — GLIPIZIDE ER 2.5 MG PO TB24: 2.5000 mg | ORAL_TABLET | Freq: Every day | ORAL | 1 refills | Status: DC

## 2022-09-12 NOTE — Progress Notes (Signed)
Endocrinology Follow Up Note       09/12/2022, 9:07 AM   Subjective:    Patient ID: Vickie Ward, female    DOB: July 04, 1945.  Vickie Ward is being seen in follow up after being seen in consultation for management of currently uncontrolled symptomatic diabetes requested by  Garald Braver.   Past Medical History:  Diagnosis Date   Anemia    Arthritis    Cervical myelopathy (HCC) 02/04/2018   Diabetes mellitus without complication (HCC)    Factor V Leiden (HCC)    GERD (gastroesophageal reflux disease)    pt takes OTC meds   Headache(784.0)    Insomnia    Numbness and tingling    in L arm from neck issues   Osteoporosis    Overactive bladder    takes Vesicare   PONV (postoperative nausea and vomiting)    Sleep apnea    sleep study done > 77yrs ago;no cpap and since lost weight much improvement   UTI (lower urinary tract infection)    currently taking Cipro   Visual floaters     Past Surgical History:  Procedure Laterality Date   ABDOMINAL HYSTERECTOMY     ANTERIOR CERVICAL DECOMP/DISCECTOMY FUSION N/A 03/18/2013   Procedure: ACDF C4 - C7 3 LEVELS;  Surgeon: Venita Lick, MD;  Location: [facility] OR;  Service: Orthopedics;  Laterality: N/A;   COLONOSCOPY     DILATION AND CURETTAGE OF UTERUS     REPLACEMENT TOTAL KNEE     2019   TONSILLECTOMY     adenoidectomy    Social History   Socioeconomic History   Marital status: Married    Spouse name: Not on file   Number of children: Not on file   Years of education: Not on file   Highest education level: Not on file  Occupational History   Occupation: Retired  Tobacco Use   Smoking status: Never   Smokeless tobacco: Never  Vaping Use   Vaping Use: Never used  Substance and Sexual Activity   Alcohol use: Yes    Alcohol/week: 1.0 - 2.0 standard drink of alcohol    Types: 1 - 2 Glasses of wine per week    Comment: daily   Drug use:  Never   Sexual activity: Not on file  Other Topics Concern   Not on file  Social History Narrative   Not on file   Social Determinants of Health   Financial Resource Strain: Not on file  Food Insecurity: Not on file  Transportation Needs: Not on file  Physical Activity: Not on file  Stress: Not on file  Social Connections: Not on file    Family History  Problem Relation Age of Onset   Heart attack Mother    Hypertension Mother    Diabetes type II Mother    High Cholesterol Mother    Hyperlipidemia Mother    Heart failure Mother    Diabetes type II Father    Lung cancer Father    Hypertension Sister    Vascular Disease Sister    Diabetes type II Sister    COPD Brother    Drug abuse Brother    Drug  abuse Brother    Stomach cancer Brother     Outpatient Encounter Medications as of 09/12/2022  Medication Sig   cholecalciferol (VITAMIN D) 1000 units tablet Take 6,000 Units by mouth daily.   cyanocobalamin (,VITAMIN B-12,) 1000 MCG/ML injection Inject 1,000 mcg into the muscle every 30 (thirty) days.   cyanocobalamin 1000 MCG tablet Take by mouth.   estradiol (ESTRACE) 0.1 MG/GM vaginal cream estradiol 0.01% (0.1 mg/gram) vaginal cream  apply a pea sized AMOUNT VAGINALLY (bladder opening at the vagina) AS DIRECTED NIGHTLY FOR SIX WEEKS, THEN TWICE A WEEK FOR maintenance   glipiZIDE (GLUCOTROL XL) 2.5 MG 24 hr tablet Take 1 tablet (2.5 mg total) by mouth daily with breakfast.   glucose blood (ONETOUCH VERIO) test strip 1 each by Other route as needed for other. Use as instructed   Melatonin-Pyridoxine 10-10 MG TBCR Take by mouth.   metFORMIN (GLUCOPHAGE-XR) 750 MG 24 hr tablet Take 1 tablet (750 mg total) by mouth daily with breakfast.   nitroGLYCERIN (NITROSTAT) 0.4 MG SL tablet nitroglycerin 0.4 mg sublingual tablet  TABLET 0.4MG  UNDER THE TONGUE EVERY FIVE MINUTES AS NEEDED FOR CHEST PAIN. DO NOT EXCEED THREE DOSES IN 15 MINUTES   Omega-3 1000 MG CAPS Take by mouth.    ramipril (ALTACE) 2.5 MG capsule Take 2.5 mg by mouth daily.   XARELTO 20 MG TABS tablet Take 20 mg by mouth at bedtime.   zolpidem (AMBIEN) 10 MG tablet Take 5 mg by mouth at bedtime as needed for sleep.   ELIQUIS 5 MG TABS tablet Take 5 mg by mouth 2 (two) times daily. (Patient not taking: Reported on 09/12/2022)   No facility-administered encounter medications on file as of 09/12/2022.    ALLERGIES: Allergies  Allergen Reactions   Cephalexin Other (See Comments) and Rash    Per patient, may have caused itching or swelling Other reaction(s): Other (See Comments) Pt. States of the face Other Reaction: Other reaction Per patient, may have caused itching or swelling    Codeine Nausea And Vomiting   Nitrofurantoin Rash    Other reaction(s): Other (See Comments) Mouth Ulcers    Macrobid [Nitrofurantoin Macrocrystal] Other (See Comments)    Per patient, may have caused itching or swelling   Sulfamethoxazole-Trimethoprim     Other reaction(s): Other (See Comments) Oral thrush    VACCINATION STATUS:  There is no immunization history on file for this patient.  Diabetes She presents for her follow-up diabetic visit. She has type 2 diabetes mellitus. Onset time: Diagnosed at approx age of 77. Her disease course has been fluctuating. There are no hypoglycemic associated symptoms. Associated symptoms include fatigue. Pertinent negatives for diabetes include no weakness. There are no hypoglycemic complications. Symptoms are stable. There are no diabetic complications. Risk factors for coronary artery disease include diabetes mellitus, dyslipidemia, family history, hypertension, stress, sedentary lifestyle and post-menopausal. Current diabetic treatment includes oral agent (monotherapy). She is compliant with treatment most of the time. Her weight is increasing steadily. She is following a generally unhealthy diet. When asked about meal planning, she reported none. She has not had a previous  visit with a dietitian. She rarely participates in exercise. Her breakfast blood glucose range is generally >200 mg/dl. (She presents today with her meter and logs, showing above target glycemic profile.  Her POCT A1c today is 7.5%, improving from last visit of 7.6%.  She notes she went to the hospital since last visit for Afib and is now on Xarelto but wonders if she still  needs to take it.  She denies any hypoglycemia.  She admits she has been eating small candy bars for dessert after supper.) An ACE inhibitor/angiotensin II receptor blocker is being taken. She does not see a podiatrist.Eye exam is current.     Review of systems  Constitutional: + Minimally fluctuating body weight, current Body mass index is 31.39 kg/m., + fatigue- says these come in waves, no subjective hyperthermia, no subjective hypothermia Eyes: no blurry vision, no xerophthalmia ENT: no sore throat, no nodules palpated in throat, no dysphagia/odynophagia, no hoarseness Cardiovascular: no chest pain, no shortness of breath, no palpitations, no leg swelling Respiratory: no cough, no shortness of breath Gastrointestinal: + intermittent nausea/vomiting/diarrhea Musculoskeletal: no muscle/joint aches Skin: no rashes, no hyperemia Neurological: no tremors, no numbness, no tingling, + intermittent dizziness Psychiatric: + depression, + anxiety, + insomnia  Objective:     BP 138/80 (BP Location: Left Arm, Patient Position: Sitting, Cuff Size: Large)   Pulse (!) 59   Ht 5\' 2"  (1.575 m)   Wt 171 lb 9.6 oz (77.8 kg)   BMI 31.39 kg/m   Wt Readings from Last 3 Encounters:  09/12/22 171 lb 9.6 oz (77.8 kg)  05/14/22 168 lb 8 oz (76.4 kg)  05/14/22 168 lb 9.6 oz (76.5 kg)     BP Readings from Last 3 Encounters:  09/12/22 138/80  05/14/22 111/68  02/12/22 130/69      Physical Exam- Limited  Constitutional:  Body mass index is 31.39 kg/m. , not in acute distress, normal state of mind Eyes:  EOMI, no  exophthalmos Musculoskeletal: no gross deformities, strength intact in all four extremities, no gross restriction of joint movements Skin:  no rashes, no hyperemia Neurological: no tremor with outstretched hands   Diabetic Foot Exam - Simple   No data filed     CMP ( most recent) CMP     Component Value Date/Time   BUN 21 12/17/2021 0000   CREATININE 0.7 12/17/2021 0000     Diabetic Labs (most recent): Lab Results  Component Value Date   HGBA1C 7.5 (A) 09/12/2022   HGBA1C 7.2 (A) 05/14/2022   HGBA1C 7.7 12/17/2021   MICROALBUR 0.9 12/17/2021   MICROALBUR 10 07/31/2021     Lipid Panel ( most recent) Lipid Panel     Component Value Date/Time   TRIG 145 12/17/2021 0000   LDLCALC 110 12/17/2021 0000      Lab Results  Component Value Date   TSH 1.510 07/17/2021   FREET4 1.05 07/17/2021           Assessment & Plan:   1) Type 2 diabetes mellitus without complication, without long-term current use of insulin (HCC)  She presents today with her meter and logs, showing above target glycemic profile.  Her POCT A1c today is 7.5%, improving from last visit of 7.6%.  She notes she went to the hospital since last visit for Afib and is now on Xarelto but wonders if she still needs to take it.  She denies any hypoglycemia.  She admits she has been eating small candy bars for dessert after supper.  - BRUNILDA MONTIERTH has currently uncontrolled symptomatic type 2 DM since 77 years of age.   -Recent labs reviewed.  - I had a long discussion with her about the progressive nature of diabetes and the pathology behind its complications. -her diabetes is not currently complicated but she remains at a high risk for more acute and chronic complications which include CAD, CVA, CKD,  retinopathy, and neuropathy. These are all discussed in detail with her.  The following Lifestyle Medicine recommendations according to American College of Lifestyle Medicine [facility]) were discussed and  offered to patient and she agrees to start the journey:  A. Whole Foods, Plant-based plate comprising of fruits and vegetables, plant-based proteins, whole-grain carbohydrates was discussed in detail with the patient.   A list for source of those nutrients were also provided to the patient.  Patient will use only water or unsweetened tea for hydration. B.  The need to stay away from risky substances including alcohol, smoking; obtaining 7 to 9 hours of restorative sleep, at least 150 minutes of moderate intensity exercise weekly, the importance of healthy social connections,  and stress reduction techniques were discussed. C.  A full color page of  Calorie density of various food groups per pound showing examples of each food groups was provided to the patient.  - Nutritional counseling repeated at each appointment due to patients tendency to fall back in to old habits.  - The patient admits there is a room for improvement in their diet and drink choices. -  Suggestion is made for the patient to avoid simple carbohydrates from their diet including Cakes, Sweet Desserts / Pastries, Ice Cream, Soda (diet and regular), Sweet Tea, Candies, Chips, Cookies, Sweet Pastries, Store Bought Juices, Alcohol in Excess of 1-2 drinks a day, Artificial Sweeteners, Coffee Creamer, and "Sugar-free" Products. This will help patient to have stable blood glucose profile and potentially avoid unintended weight gain.   - I encouraged the patient to switch to unprocessed or minimally processed complex starch and increased protein intake (animal or plant source), fruits, and vegetables.   - Patient is advised to stick to a routine mealtimes to eat 3 meals a day and avoid unnecessary snacks (to snack only to correct hypoglycemia).  - she is being followed by Norm Salt, RDN, CDE for diabetes education- had an appointment with her today.  - I have approached her with the following individualized plan to manage her diabetes  and patient agrees:   -She is advised to continue her Metformin 750 mg ER po daily with breakfast.  I discussed and initiated low dose Glipizide 2.5 mg XL daily with breakfast.  She has sensitivity to Sulfa meds, therefore we talked about potential drug reactions to look out for, she is less likely to have these due to low dose ER form.  -she is encouraged to continue monitoring blood glucose at least twice daily daily, before breakfast and before bed, and to call the clinic if she has readings less than 70 or above 300 for 3 tests in a row.    - Adjustment parameters are given to her for hypo and hyperglycemia in writing.  - she will be considered for incretin therapy as appropriate next visit, although given her significant GI symptoms, she may not tolerate.  - Specific targets for  A1c; LDL, HDL, and Triglycerides were discussed with the patient.  2) Blood Pressure /Hypertension:  her blood pressure is controlled to target for her age.   she is advised to continue her current medications including Ramipril 2.5 mg p.o. daily with breakfast.  3) Lipids/Hyperlipidemia:    Review of her recent lipid panel from 06/27/22 showed uncontrolled LDL at 97 and elevated triglycerides of 178.  She is not currently on any lipid lowering medications.    4)  Weight/Diet:  her Body mass index is 31.39 kg/m.  -  clearly complicating her diabetes  care.   she is a candidate for weight loss. I discussed with her the fact that loss of 5 - 10% of her  current body weight will have the most impact on her diabetes management.  Exercise, and detailed carbohydrates information provided  -  detailed on discharge instructions.  5) Chronic Care/Health Maintenance: -she is on ACEI/ARB and not on Statin medications and is encouraged to initiate and continue to follow up with Ophthalmology, Dentist, Podiatrist at least yearly or according to recommendations, and advised to stay away from smoking. I have recommended yearly  flu vaccine and pneumonia vaccine at least every 5 years; moderate intensity exercise for up to 150 minutes weekly; and sleep for at least 7 hours a day.  - she is advised to maintain close follow up with Richarda Blade E for primary care needs, as well as her other providers for optimal and coordinated care.      I spent  42  minutes in the care of the patient today including review of labs from CMP, Lipids, Thyroid Function, Hematology (current and previous including abstractions from other facilities); face-to-face time discussing  her blood glucose readings/logs, discussing hypoglycemia and hyperglycemia episodes and symptoms, medications doses, her options of short and long term treatment based on the latest standards of care / guidelines;  discussion about incorporating lifestyle medicine;  and documenting the encounter. Risk reduction counseling performed per USPSTF guidelines to reduce obesity and cardiovascular risk factors.     Please refer to Patient Instructions for Blood Glucose Monitoring and Insulin/Medications Dosing Guide"  in media tab for additional information. Please  also refer to " Patient Self Inventory" in the Media  tab for reviewed elements of pertinent patient history.  Vickie Ward participated in the discussions, expressed understanding, and voiced agreement with the above plans.  All questions were answered to her satisfaction. she is encouraged to contact clinic should she have any questions or concerns prior to her return visit.     Follow up plan: - Return in about 3 months (around 12/13/2022) for Diabetes F/U with A1c in office, No previsit labs, Bring meter and logs.   Ronny Bacon, [facility] [address] [place], [place] [address] Phone: [phone] Fax: [phone]  09/12/2022, 9:07 AM

## 2022-11-28 ENCOUNTER — Inpatient Hospital Stay: Payer: Medicare Other | Attending: Hematology | Admitting: Hematology

## 2022-11-28 VITALS — BP 159/62 | HR 60 | Temp 99.6°F | Resp 16 | Ht 63.0 in | Wt 171.2 lb

## 2022-11-28 DIAGNOSIS — Z7984 Long term (current) use of oral hypoglycemic drugs: Secondary | ICD-10-CM | POA: Insufficient documentation

## 2022-11-28 DIAGNOSIS — D6851 Activated protein C resistance: Secondary | ICD-10-CM | POA: Diagnosis present

## 2022-11-28 DIAGNOSIS — E119 Type 2 diabetes mellitus without complications: Secondary | ICD-10-CM | POA: Insufficient documentation

## 2022-11-28 DIAGNOSIS — Z7901 Long term (current) use of anticoagulants: Secondary | ICD-10-CM | POA: Diagnosis not present

## 2022-11-28 DIAGNOSIS — Z87440 Personal history of urinary (tract) infections: Secondary | ICD-10-CM | POA: Diagnosis not present

## 2022-11-28 DIAGNOSIS — R7989 Other specified abnormal findings of blood chemistry: Secondary | ICD-10-CM | POA: Diagnosis not present

## 2022-11-28 DIAGNOSIS — M25561 Pain in right knee: Secondary | ICD-10-CM | POA: Insufficient documentation

## 2022-11-28 NOTE — Progress Notes (Signed)
[facility] [address], [place] [address]   Clinic Day:  11/28/2022  Referring physician: Jene Every, MD  Patient Care Team: Garald Braver as PCP - General (Nurse Practitioner) Mayo, Baxter Kail, PA-C as Referring Physician (Physician Assistant) Sheran Luz, MD as Consulting Physician (Physical Medicine and Rehabilitation) Dani Gobble, NP as Nurse Practitioner (Nurse Practitioner)   ASSESSMENT & PLAN:   Assessment:  1.  Factor V Leiden heterozygosity: - Seen at the request of Dr. Shelle Iron for history of factor V Leiden heterozygosity and upcoming right total knee replacement surgery - Patient had factor V Leiden mutation checked as part of a [facility] study for hormone replacement therapy in September 2010, found to have factor V Leiden heterozygosity. - She never had any history of thrombosis.  No prior history of miscarriages. - She was reportedly started on Xarelto by Dr. Hyacinth Meeker in October 2023.  Reason not entirely clear.  She thinks she had 1 episode of A-fib.  She has been tolerating it well. - She had left TKR done 17 years ago, remembers self injecting "blood thinner shots" perioperatively. - She had neck surgery done 8 years ago, did not receive any anticoagulation per patient.  2.  Social/family history: - She is independent of ADLs and IADLs.  She is retired Emergency planning/management officer duty care.  Non-smoker. - Sister, daughter and granddaughter or factor V Leiden carriers. - Another sister whose factor V status is not known, had DVT and also had other risk factors including morbid obesity, venous insufficiency, lymphedema.  No other family members had any thrombosis or miscarriages. - Father died of lung cancer, brother had lung cancer and has had lung cancer.  Plan:  1.  Factor V Leiden heterozygosity: - She did not have any personal history of thrombosis.  1 sister had DVT but she also had other risk factors. - I would screen  for other inherited thrombophilias. - In the absence of any other inherited thrombophilias, she may receive routine VTE prophylaxis after knee replacement surgery.   Orders Placed This Encounter  Procedures   Prothrombin gene mutation    Standing Status:   Future    Standing Expiration Date:   11/28/2023   Protein C activity    Standing Status:   Future    Standing Expiration Date:   11/28/2023   Protein C, total    Standing Status:   Future    Standing Expiration Date:   11/28/2023   Protein S activity    Standing Status:   Future    Standing Expiration Date:   11/28/2023   Protein S, total    Standing Status:   Future    Standing Expiration Date:   11/28/2023   Antithrombin III    Standing Status:   Future    Standing Expiration Date:   11/28/2023   Lupus anticoagulant panel    Standing Status:   Future    Standing Expiration Date:   11/28/2023   Cardiolipin antibodies, IgG, IgM, IgA    Standing Status:   Future    Standing Expiration Date:   11/28/2023   Beta-2-glycoprotein i abs, IgG/M/A    Standing Status:   Future    Standing Expiration Date:   11/28/2023      Alben Deeds Teague,acting as a scribe for Doreatha Massed, MD.,have documented all relevant documentation on the behalf of Doreatha Massed, MD,as directed by  Doreatha Massed, MD while in the presence of Doreatha Massed, MD.  I, Doreatha Massed MD, have reviewed the above documentation for accuracy and completeness, and I agree with the above.   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 female presenting to clinic today for evaluation of right knee pain complicated with Factor V Leiden heterozygosity at the request of Jene Every, MD. [name].  Her most recent HbG  A1c panel on 09/12/22 found abnormal Hemoglobin A1c which was elevated at 7.5%.   Today, she states that she is doing well overall. Her appetite level is at 100%. Her energy level is at 40%.    PAST MEDICAL HISTORY:   Past Medical History: Past Medical History:  Diagnosis Date   Anemia    Arthritis    Cervical myelopathy (HCC) 02/04/2018   Diabetes mellitus without complication (HCC)    Factor V Leiden (HCC)    GERD (gastroesophageal reflux disease)    pt takes OTC meds   Headache(784.0)    Insomnia    Numbness and tingling    in L arm from neck issues   Osteoporosis    Overactive bladder    takes Vesicare   PONV (postoperative nausea and vomiting)    Sleep apnea    sleep study done > 66yrs ago;no cpap and since lost weight much improvement   UTI (lower urinary tract infection)    currently taking Cipro   Visual floaters     Surgical History: Past Surgical History:  Procedure Laterality Date   ABDOMINAL HYSTERECTOMY     ANTERIOR CERVICAL DECOMP/DISCECTOMY FUSION N/A 03/18/2013   Procedure: ACDF C4 - C7 3 LEVELS;  Surgeon: Venita Lick, MD;  Location: [facility] OR;  Service: Orthopedics;  Laterality: N/A;   COLONOSCOPY     DILATION AND CURETTAGE OF UTERUS     REPLACEMENT TOTAL KNEE     2019   TONSILLECTOMY     adenoidectomy    Social History: Social History   Socioeconomic History   Marital status: Married    Spouse name: Not on file   Number of children: Not on file   Years of education: Not on file   Highest education level: Not on file  Occupational History   Occupation: Retired  Tobacco Use   Smoking status: Never   Smokeless tobacco: Never  Vaping Use   Vaping status: Never Used  Substance and Sexual Activity   Alcohol use: Yes    Alcohol/week: 1.0 - 2.0 standard drink of alcohol    Types: 1 - 2 Glasses of wine per week    Comment: daily   Drug use: Never   Sexual activity: Not on file  Other Topics Concern   Not on file  Social History Narrative    Not on file   Social Determinants of Health   Financial Resource Strain: Not on file  Food Insecurity: Not on file  Transportation Needs: Not on file  Physical Activity: Not on file  Stress: Not on file  Social Connections: Not on file  Intimate Partner Violence: Not on file    Family History: Family History  Problem Relation Age of Onset   Heart attack Mother    Hypertension Mother    Diabetes type II Mother    High Cholesterol Mother    Hyperlipidemia Mother    Heart failure  Mother    Diabetes type II Father    Lung cancer Father    Hypertension Sister    Vascular Disease Sister    Diabetes type II Sister    COPD Brother    Drug abuse Brother    Drug abuse Brother    Stomach cancer Brother     Current Medications:  Current Outpatient Medications:    cholecalciferol (VITAMIN D) 1000 units tablet, Take 6,000 Units by mouth daily., Disp: , Rfl:    cyanocobalamin (,VITAMIN B-12,) 1000 MCG/ML injection, Inject 1,000 mcg into the muscle every 30 (thirty) days., Disp: , Rfl:    cyanocobalamin 1000 MCG tablet, Take by mouth., Disp: , Rfl:    ELIQUIS 5 MG TABS tablet, Take 5 mg by mouth 2 (two) times daily., Disp: , Rfl:    estradiol (ESTRACE) 0.1 MG/GM vaginal cream, estradiol 0.01% (0.1 mg/gram) vaginal cream  apply a pea sized AMOUNT VAGINALLY (bladder opening at the vagina) AS DIRECTED NIGHTLY FOR SIX WEEKS, THEN TWICE A WEEK FOR maintenance, Disp: , Rfl:    glipiZIDE (GLUCOTROL XL) 2.5 MG 24 hr tablet, Take 1 tablet (2.5 mg total) by mouth daily with breakfast., Disp: 90 tablet, Rfl: 1   glucose blood (ONETOUCH VERIO) test strip, 1 each by Other route as needed for other. Use as instructed, Disp: , Rfl:    Melatonin-Pyridoxine 10-10 MG TBCR, Take by mouth., Disp: , Rfl:    metFORMIN (GLUCOPHAGE-XR) 750 MG 24 hr tablet, Take 1 tablet (750 mg total) by mouth daily with breakfast., Disp: 90 tablet, Rfl: 1   nitroGLYCERIN (NITROSTAT) 0.4 MG SL tablet, nitroglycerin 0.4 mg  sublingual tablet  TABLET 0.4MG  UNDER THE TONGUE EVERY FIVE MINUTES AS NEEDED FOR CHEST PAIN. DO NOT EXCEED THREE DOSES IN 15 MINUTES, Disp: , Rfl:    Omega-3 1000 MG CAPS, Take by mouth., Disp: , Rfl:    ramipril (ALTACE) 2.5 MG capsule, Take 2.5 mg by mouth daily., Disp: , Rfl:    XARELTO 20 MG TABS tablet, Take 20 mg by mouth at bedtime., Disp: , Rfl:    zolpidem (AMBIEN) 10 MG tablet, Take 5 mg by mouth at bedtime as needed for sleep., Disp: , Rfl:    Allergies: Allergies  Allergen Reactions   Cephalexin Other (See Comments) and Rash    Per patient, may have caused itching or swelling Other reaction(s): Other (See Comments) Pt. States of the face Other Reaction: Other reaction Per patient, may have caused itching or swelling    Codeine Nausea And Vomiting   Nitrofurantoin Rash    Other reaction(s): Other (See Comments) Mouth Ulcers    Macrobid [Nitrofurantoin Macrocrystal] Other (See Comments)    Per patient, may have caused itching or swelling   Sulfamethoxazole-Trimethoprim     Other reaction(s): Other (See Comments) Oral thrush    REVIEW OF SYSTEMS:   Review of Systems  Constitutional:  Negative for chills, fatigue and fever.  HENT:   Negative for lump/mass, mouth sores, nosebleeds, sore throat and trouble swallowing.   Eyes:  Negative for eye problems.  Respiratory:  Positive for cough. Negative for shortness of breath.   Cardiovascular:  Negative for chest pain, leg swelling and palpitations.  Gastrointestinal:  Negative for abdominal pain, constipation, diarrhea, nausea and vomiting.  Genitourinary:  Negative for bladder incontinence, difficulty urinating, dysuria, frequency, hematuria and nocturia.   Musculoskeletal:  Negative for arthralgias, back pain, flank pain, myalgias and neck pain.  Skin:  Negative for itching and rash.  Neurological:  Negative for dizziness, headaches and numbness.  Hematological:  Does not bruise/bleed easily.  Psychiatric/Behavioral:   Positive for sleep disturbance. Negative for depression and suicidal ideas. The patient is not nervous/anxious.   All other systems reviewed and are negative.    VITALS:   Blood pressure (!) 159/62, pulse 60, temperature 99.6 F (37.6 C), temperature source Tympanic, resp. rate 16, height 5\' 3"  (1.6 m), weight 171 lb 3.2 oz (77.7 kg), SpO2 98%.  Wt Readings from Last 3 Encounters:  11/28/22 171 lb 3.2 oz (77.7 kg)  09/12/22 171 lb 9.6 oz (77.8 kg)  05/14/22 168 lb 8 oz (76.4 kg)    Body mass index is 30.33 kg/m.   PHYSICAL EXAM:   Physical Exam Vitals and nursing note reviewed. Exam conducted with a chaperone present.  Constitutional:      Appearance: Normal appearance.  Cardiovascular:     Rate and Rhythm: Normal rate and regular rhythm.     Pulses: Normal pulses.     Heart sounds: Normal heart sounds.  Pulmonary:     Effort: Pulmonary effort is normal.     Breath sounds: Normal breath sounds.  Abdominal:     Palpations: Abdomen is soft. There is no hepatomegaly, splenomegaly or mass.     Tenderness: There is no abdominal tenderness.  Musculoskeletal:     Right lower leg: No edema.     Left lower leg: No edema.  Lymphadenopathy:     Cervical: No cervical adenopathy.     Right cervical: No superficial, deep or posterior cervical adenopathy.    Left cervical: No superficial, deep or posterior cervical adenopathy.     Upper Body:     Right upper body: No supraclavicular or axillary adenopathy.     Left upper body: No supraclavicular or axillary adenopathy.  Neurological:     General: No focal deficit present.     Mental Status: She is alert and oriented to person, place, and time.  Psychiatric:        Mood and Affect: Mood normal.        Behavior: Behavior normal.     LABS:      Latest Ref Rng & Units 12/17/2021   12:00 AM  CBC  Hemoglobin 12.0 - 16.0 9.5      Hematocrit 36 - 46 33         This result is from an external source.      Latest Ref Rng & Units  12/17/2021   12:00 AM  CMP  BUN 4 - 21 21      Creatinine 0.5 - 1.1 0.7         This result is from an external source.     No results found for: "CEA1", "CEA" / No results found for: "CEA1", "CEA" No results found for: "PSA1" No results found for: "WUJ811" No results found for: "CAN125"  No results found for: "TOTALPROTELP", "ALBUMINELP", "A1GS", "A2GS", "BETS", "BETA2SER", "GAMS", "MSPIKE", "SPEI" No results found for: "TIBC", "FERRITIN", "IRONPCTSAT" No results found for: "LDH"   STUDIES:   No results found.

## 2022-11-28 NOTE — Patient Instructions (Addendum)
[facility]  Discharge Instructions  You were seen and examined today by Dr. Ellin Saba. Dr. Ellin Saba is a hematologist, meaning that he specializes in blood abnormalities. Dr. Ellin Saba discussed your past medical history, family history of cancers/blood conditions and the events that led to you being here today.  You were referred to Dr. Ellin Saba due to Factor V deficiency.  Dr. Ellin Saba has recommended additional labs today for further evaluation.  Follow-up as scheduled.   Thank you for choosing [facility] to provide your oncology and hematology care.   To afford each patient quality time with our provider, please arrive at least 15 minutes before your scheduled appointment time. You may need to reschedule your appointment if you arrive late (10 or more minutes). Arriving late affects you and other patients whose appointments are after yours.  Also, if you miss three or more appointments without notifying the office, you may be dismissed from the clinic at the provider's discretion.    Again, thank you for choosing [facility].  Our hope is that these requests will decrease the amount of time that you wait before being seen by our physicians.   If you have a lab appointment with the Cancer Center - please note that after April 8th, all labs will be drawn in the cancer center.  You do not have to check in or register with the main entrance as you have in the past but will complete your check-in at the cancer center.            _____________________________________________________________  Should you have questions after your visit to [facility], please contact our office at [phone] and follow the prompts.  Our office hours are 8:00 a.m. to 4:30 p.m. Monday - Thursday and 8:00 a.m. to 2:30 p.m. Friday.  Please note that voicemails left after 4:00 p.m. may not be returned until the following  business day.  We are closed weekends and all major holidays.  You do have access to a nurse 24-7, just call the main number to the clinic [phone] and do not press any options, hold on the line and a nurse will answer the phone.    For prescription refill requests, have your pharmacy contact our office and allow 72 hours.    Masks are no longer required in the cancer centers. If you would like for your care team to wear a mask while they are taking care of you, please let them know. You may have one support person who is at least 77 years old accompany you for your appointments.

## 2022-11-29 ENCOUNTER — Other Ambulatory Visit: Payer: Medicare Other

## 2022-12-02 ENCOUNTER — Inpatient Hospital Stay: Payer: Medicare Other

## 2022-12-02 DIAGNOSIS — D6851 Activated protein C resistance: Secondary | ICD-10-CM | POA: Diagnosis not present

## 2022-12-02 LAB — ANTITHROMBIN III: AntiThromb III Func: 114 % (ref 75–120)

## 2022-12-03 ENCOUNTER — Inpatient Hospital Stay: Payer: Medicare Other | Admitting: Hematology

## 2022-12-11 NOTE — Progress Notes (Signed)
[facility] [address] [place] [address]   Clinic Day:  12/12/2022  Referring physician: Garald Braver, FNP  Patient Care Team: Garald Braver as PCP - General (Nurse Practitioner) Mayo, Baxter Kail, PA-C as Referring Physician (Physician Assistant) Sheran Luz, MD as Consulting Physician (Physical Medicine and Rehabilitation) Dani Gobble, NP as Nurse Practitioner (Nurse Practitioner) Jene Every, MD as Consulting Physician (Orthopedic Surgery) Daryel November, MD (Internal Medicine)   ASSESSMENT & PLAN:   Assessment:  1.  Factor V Leiden heterozygosity: - Seen at the request of Dr. Shelle Iron for history of factor V Leiden heterozygosity and upcoming right total knee replacement surgery - Patient had factor V Leiden mutation checked as part of a [facility] study for hormone replacement therapy in September 2010, found to have factor V Leiden heterozygosity. - She never had any history of thrombosis.  No prior history of miscarriages. - She was reportedly started on Xarelto by Dr. Hyacinth Meeker in October 2023.  Reason not entirely clear.  She thinks she had 1 episode of A-fib.  She has been tolerating it well. - She had left TKR done 17 years ago, remembers self injecting "blood thinner shots" perioperatively. - She had neck surgery done 8 years ago, did not receive any anticoagulation per patient.  2.  Social/family history: - She is independent of ADLs and IADLs.  She is retired Emergency planning/management officer duty care.  Non-smoker. - Sister, daughter and granddaughter or factor V Leiden carriers. - Another sister whose factor V status is not known, had DVT and also had other risk factors including morbid obesity, venous insufficiency, lymphedema.  No other family members had any thrombosis or miscarriages. - Father died of lung cancer, brother had lung cancer and has had lung cancer.  Plan:  1.  Factor V Leiden heterozygosity: - We reviewed  hypercoagulable workup including lupus anticoagulant, anticardiolipin antibodies and antibeta 2 glycoprotein 1 antibodies negative. - Antithrombin III, protein C and protein S activity was normal.  Prothrombin gene mutation was negative. - She did not have a personal history of thrombosis. - With factor V Leiden heterozygosity and absence of other inherited thrombophilias, I would recommend that she receive routine VTE prophylaxis after knee replacement surgery. - She will talk to Dr. Hyacinth Meeker in September.  She is currently on Xarelto, with reasons not clear.  She does not need to be on Xarelto if it was started for factor V Leiden heterozygosity.    No orders of the defined types were placed in this encounter.      Alben Deeds Teague,acting as a Neurosurgeon for Doreatha Massed, MD.,have documented all relevant documentation on the behalf of Doreatha Massed, MD,as directed by  Doreatha Massed, MD while in the presence of Doreatha Massed, MD.  I, Doreatha Massed MD, have reviewed the above documentation for accuracy and completeness, and I agree with the above.    Doreatha Massed, MD   8/15/20246:25 PM  CHIEF COMPLAINT/PURPOSE OF CONSULT:   Diagnosis: right knee pain complicated with Factor V deficiency  Current Therapy: Xarelto  HISTORY OF PRESENT ILLNESS:   Vickie Ward is a 77 y.o. female presenting to clinic today for evaluation of right knee pain complicated with Factor V Leiden heterozygosity at the request of Jene Every, MD. [name].  Her most recent HbG A1c panel on 09/12/22 found abnormal Hemoglobin A1c which was elevated at 7.5%.   Today, she states that she is doing well  overall. Her appetite level is at 100%. Her energy level is at 40%.   INTERVAL HISTORY:   Vickie Ward is a 77 y.o. female presenting to the clinic today for follow-up of Factor V Leiden heterozygosity. She was last seen by me on  11/28/22 in consultation.  Today, she states that she is doing well overall. Her appetite level is at 75%. Her energy level is at 10%.   PAST MEDICAL HISTORY:   Past Medical History: Past Medical History:  Diagnosis Date   Anemia    Arthritis    Cervical myelopathy (HCC) 02/04/2018   Diabetes mellitus without complication (HCC)    Factor V Leiden (HCC)    GERD (gastroesophageal reflux disease)    pt takes OTC meds   Headache(784.0)    Insomnia    Numbness and tingling    in L arm from neck issues   Osteoporosis    Overactive bladder    takes Vesicare   PONV (postoperative nausea and vomiting)    Sleep apnea    sleep study done > 70yrs ago;no cpap and since lost weight much improvement   UTI (lower urinary tract infection)    currently taking Cipro   Visual floaters     Surgical History: Past Surgical History:  Procedure Laterality Date   ABDOMINAL HYSTERECTOMY     ANTERIOR CERVICAL DECOMP/DISCECTOMY FUSION N/A 03/18/2013   Procedure: ACDF C4 - C7 3 LEVELS;  Surgeon: Venita Lick, MD;  Location: [facility] OR;  Service: Orthopedics;  Laterality: N/A;   COLONOSCOPY     DILATION AND CURETTAGE OF UTERUS     REPLACEMENT TOTAL KNEE     2019   TONSILLECTOMY     adenoidectomy    Social History: Social History   Socioeconomic History   Marital status: Married    Spouse name: Not on file   Number of children: Not on file   Years of education: Not on file   Highest education level: Not on file  Occupational History   Occupation: Retired  Tobacco Use   Smoking status: Never   Smokeless tobacco: Never  Vaping Use   Vaping status: Never Used  Substance and Sexual Activity   Alcohol use: Yes    Alcohol/week: 1.0 - 2.0 standard drink of alcohol    Types: 1 - 2 Glasses of wine per week    Comment: daily   Drug use: Never   Sexual activity: Not on file  Other Topics Concern   Not on file  Social History Narrative   Not on file   Social Determinants of Health    Financial Resource Strain: Not on file  Food Insecurity: Not on file  Transportation Needs: Not on file  Physical Activity: Not on file  Stress: Not on file  Social Connections: Not on file  Intimate Partner Violence: Not on file    Family History: Family History  Problem Relation Age of Onset   Heart attack Mother    Hypertension Mother    Diabetes type II Mother    High Cholesterol Mother    Hyperlipidemia Mother    Heart failure Mother    Diabetes type II Father    Lung cancer Father    Hypertension Sister    Vascular Disease Sister    Diabetes type II Sister    COPD Brother    Drug abuse Brother    Drug abuse Brother    Stomach cancer Brother     Current Medications:  Current Outpatient Medications:  cholecalciferol (VITAMIN D) 1000 units tablet, Take 6,000 Units by mouth daily., Disp: , Rfl:    cyanocobalamin (,VITAMIN B-12,) 1000 MCG/ML injection, Inject 1,000 mcg into the muscle every 30 (thirty) days., Disp: , Rfl:    cyanocobalamin 1000 MCG tablet, Take by mouth., Disp: , Rfl:    ELIQUIS 5 MG TABS tablet, Take 5 mg by mouth 2 (two) times daily. (Patient not taking: Reported on 12/12/2022), Disp: , Rfl:    estradiol (ESTRACE) 0.1 MG/GM vaginal cream, estradiol 0.01% (0.1 mg/gram) vaginal cream  apply a pea sized AMOUNT VAGINALLY (bladder opening at the vagina) AS DIRECTED NIGHTLY FOR SIX WEEKS, THEN TWICE A WEEK FOR maintenance, Disp: , Rfl:    glipiZIDE (GLUCOTROL XL) 2.5 MG 24 hr tablet, Take 1 tablet (2.5 mg total) by mouth daily with breakfast., Disp: 90 tablet, Rfl: 1   glucose blood (ONETOUCH VERIO) test strip, 1 each by Other route as needed for other. Use as instructed, Disp: , Rfl:    Melatonin-Pyridoxine 10-10 MG TBCR, Take by mouth., Disp: , Rfl:    metFORMIN (GLUCOPHAGE-XR) 750 MG 24 hr tablet, Take 1 tablet (750 mg total) by mouth daily with breakfast., Disp: 90 tablet, Rfl: 1   nitroGLYCERIN (NITROSTAT) 0.4 MG SL tablet, nitroglycerin 0.4 mg  sublingual tablet  TABLET 0.4MG  UNDER THE TONGUE EVERY FIVE MINUTES AS NEEDED FOR CHEST PAIN. DO NOT EXCEED THREE DOSES IN 15 MINUTES, Disp: , Rfl:    Omega-3 1000 MG CAPS, Take by mouth., Disp: , Rfl:    ramipril (ALTACE) 2.5 MG capsule, Take 2.5 mg by mouth daily., Disp: , Rfl:    XARELTO 20 MG TABS tablet, Take 20 mg by mouth at bedtime., Disp: , Rfl:    zolpidem (AMBIEN) 10 MG tablet, Take 5 mg by mouth at bedtime as needed for sleep., Disp: , Rfl:    Allergies: Allergies  Allergen Reactions   Cephalexin Other (See Comments) and Rash    Per patient, may have caused itching or swelling Other reaction(s): Other (See Comments) Pt. States of the face Other Reaction: Other reaction Per patient, may have caused itching or swelling    Codeine Nausea And Vomiting   Nitrofurantoin Rash    Other reaction(s): Other (See Comments) Mouth Ulcers    Macrobid [Nitrofurantoin Macrocrystal] Other (See Comments)    Per patient, may have caused itching or swelling   Sulfamethoxazole-Trimethoprim     Other reaction(s): Other (See Comments) Oral thrush    REVIEW OF SYSTEMS:   Review of Systems  Constitutional:  Positive for fatigue. Negative for chills and fever.  HENT:   Positive for trouble swallowing. Negative for lump/mass, mouth sores, nosebleeds and sore throat.   Eyes:  Negative for eye problems.  Respiratory:  Negative for cough and shortness of breath.   Cardiovascular:  Negative for chest pain, leg swelling and palpitations.  Gastrointestinal:  Negative for abdominal pain, constipation, diarrhea, nausea and vomiting.  Genitourinary:  Negative for bladder incontinence, difficulty urinating, dysuria, frequency, hematuria and nocturia.   Musculoskeletal:  Negative for arthralgias, back pain, flank pain, myalgias and neck pain.  Skin:  Negative for itching and rash.  Neurological:  Negative for dizziness, headaches and numbness.  Hematological:  Does not bruise/bleed easily.   Psychiatric/Behavioral:  Positive for sleep disturbance. Negative for depression and suicidal ideas. The patient is not nervous/anxious.   All other systems reviewed and are negative.    VITALS:   Blood pressure (!) 148/71, pulse 66, temperature 97.6 F (36.4 C), temperature  source Oral, resp. rate 20, height 5\' 3"  (1.6 m), weight 170 lb 1.6 oz (77.2 kg), SpO2 100%.  Wt Readings from Last 3 Encounters:  12/12/22 170 lb 1.6 oz (77.2 kg)  11/28/22 171 lb 3.2 oz (77.7 kg)  09/12/22 171 lb 9.6 oz (77.8 kg)    Body mass index is 30.13 kg/m.   PHYSICAL EXAM:   Physical Exam Vitals and nursing note reviewed. Exam conducted with a chaperone present.  Constitutional:      Appearance: Normal appearance.  Cardiovascular:     Rate and Rhythm: Normal rate and regular rhythm.     Pulses: Normal pulses.     Heart sounds: Normal heart sounds.  Pulmonary:     Effort: Pulmonary effort is normal.     Breath sounds: Normal breath sounds.  Abdominal:     Palpations: Abdomen is soft. There is no hepatomegaly, splenomegaly or mass.     Tenderness: There is no abdominal tenderness.  Musculoskeletal:     Right lower leg: No edema.     Left lower leg: No edema.  Lymphadenopathy:     Cervical: No cervical adenopathy.     Right cervical: No superficial, deep or posterior cervical adenopathy.    Left cervical: No superficial, deep or posterior cervical adenopathy.     Upper Body:     Right upper body: No supraclavicular or axillary adenopathy.     Left upper body: No supraclavicular or axillary adenopathy.  Neurological:     General: No focal deficit present.     Mental Status: She is alert and oriented to person, place, and time.  Psychiatric:        Mood and Affect: Mood normal.        Behavior: Behavior normal.     LABS:      Latest Ref Rng & Units 12/17/2021   12:00 AM  CBC  Hemoglobin 12.0 - 16.0 9.5      Hematocrit 36 - 46 33         This result is from an external source.       Latest Ref Rng & Units 12/17/2021   12:00 AM  CMP  BUN 4 - 21 21      Creatinine 0.5 - 1.1 0.7         This result is from an external source.     No results found for: "CEA1", "CEA" / No results found for: "CEA1", "CEA" No results found for: "PSA1" No results found for: "HQI696" No results found for: "CAN125"  No results found for: "TOTALPROTELP", "ALBUMINELP", "A1GS", "A2GS", "BETS", "BETA2SER", "GAMS", "MSPIKE", "SPEI" No results found for: "TIBC", "FERRITIN", "IRONPCTSAT" No results found for: "LDH"   STUDIES:   No results found.

## 2022-12-12 ENCOUNTER — Inpatient Hospital Stay (HOSPITAL_BASED_OUTPATIENT_CLINIC_OR_DEPARTMENT_OTHER): Payer: Medicare Other | Admitting: Hematology

## 2022-12-12 VITALS — BP 148/71 | HR 66 | Temp 97.6°F | Resp 20 | Ht 63.0 in | Wt 170.1 lb

## 2022-12-12 DIAGNOSIS — D6851 Activated protein C resistance: Secondary | ICD-10-CM | POA: Diagnosis not present

## 2022-12-12 NOTE — Patient Instructions (Addendum)
[facility] at [facility] Discharge Instructions   You were seen and examined today by Dr. Ellin Saba.  He reviewed the results of your lab work from which was all normal.   You will need to check with Dr. Hyacinth Meeker to see if you can come off the Xarelto.  You do not need to be on the Xarelto for the Factor V.   You do not require any follow up here. You may return as needed.    Thank you for choosing [facility] at [facility] to provide your oncology and hematology care.  To afford each patient quality time with our provider, please arrive at least 15 minutes before your scheduled appointment time.   If you have a lab appointment with the Cancer Center please come in thru the Main Entrance and check in at the main information desk.  You need to re-schedule your appointment should you arrive 10 or more minutes late.  We strive to give you quality time with our providers, and arriving late affects you and other patients whose appointments are after yours.  Also, if you no show three or more times for appointments you may be dismissed from the clinic at the providers discretion.     Again, thank you for choosing [facility].  Our hope is that these requests will decrease the amount of time that you wait before being seen by our physicians.       _____________________________________________________________  Should you have questions after your visit to [facility], please contact our office at [phone] and follow the prompts.  Our office hours are 8:00 a.m. and 4:30 p.m. Monday - Friday.  Please note that voicemails left after 4:00 p.m. may not be returned until the following business day.  We are closed weekends and major holidays.  You do have access to a nurse 24-7, just call the main number to the clinic [phone] and do not press any options, hold on the line and a nurse will answer the phone.    For prescription  refill requests, have your pharmacy contact our office and allow 72 hours.    Due to Covid, you will need to wear a mask upon entering the hospital. If you do not have a mask, a mask will be given to you at the Main Entrance upon arrival. For doctor visits, patients may have 1 support person age 55 or older with them. For treatment visits, patients can not have anyone with them due to social distancing guidelines and our immunocompromised population.

## 2022-12-19 ENCOUNTER — Ambulatory Visit: Payer: Medicare Other | Admitting: Nurse Practitioner

## 2023-01-20 ENCOUNTER — Ambulatory Visit: Payer: Self-pay | Admitting: Orthopedic Surgery

## 2023-01-24 ENCOUNTER — Encounter: Payer: Self-pay | Admitting: Nurse Practitioner

## 2023-01-24 ENCOUNTER — Ambulatory Visit (INDEPENDENT_AMBULATORY_CARE_PROVIDER_SITE_OTHER): Payer: Medicare Other | Admitting: Nurse Practitioner

## 2023-01-24 VITALS — BP 122/65 | HR 53 | Ht 63.0 in | Wt 169.2 lb

## 2023-01-24 DIAGNOSIS — Z7984 Long term (current) use of oral hypoglycemic drugs: Secondary | ICD-10-CM

## 2023-01-24 DIAGNOSIS — E119 Type 2 diabetes mellitus without complications: Secondary | ICD-10-CM | POA: Diagnosis not present

## 2023-01-24 DIAGNOSIS — E559 Vitamin D deficiency, unspecified: Secondary | ICD-10-CM | POA: Diagnosis not present

## 2023-01-24 DIAGNOSIS — E782 Mixed hyperlipidemia: Secondary | ICD-10-CM

## 2023-01-24 DIAGNOSIS — I10 Essential (primary) hypertension: Secondary | ICD-10-CM | POA: Diagnosis not present

## 2023-01-24 DIAGNOSIS — R5382 Chronic fatigue, unspecified: Secondary | ICD-10-CM

## 2023-01-24 LAB — POCT GLYCOSYLATED HEMOGLOBIN (HGB A1C): Hemoglobin A1C: 7.6 % — AB (ref 4.0–5.6)

## 2023-01-24 MED ORDER — GLIPIZIDE ER 2.5 MG PO TB24: 2.5000 mg | ORAL_TABLET | Freq: Every day | ORAL | 1 refills | Status: DC

## 2023-01-24 MED ORDER — METFORMIN HCL ER 750 MG PO TB24: 750.0000 mg | ORAL_TABLET | Freq: Every day | ORAL | 1 refills | Status: DC

## 2023-01-24 MED ORDER — ONETOUCH ULTRA VI STRP: ORAL_STRIP | 12 refills | Status: DC

## 2023-01-24 NOTE — Progress Notes (Signed)
Endocrinology Follow Up Note       01/24/2023, 9:01 AM   Subjective:    Patient ID: Vickie Ward, female    DOB: 1945/09/12.  Vickie Ward is being seen in follow up after being seen in consultation for management of currently uncontrolled symptomatic diabetes requested by  Garald Braver.   Past Medical History:  Diagnosis Date   Anemia    Arthritis    Cervical myelopathy (HCC) 02/04/2018   Diabetes mellitus without complication (HCC)    Factor V Leiden (HCC)    GERD (gastroesophageal reflux disease)    pt takes OTC meds   Headache(784.0)    Insomnia    Numbness and tingling    in L arm from neck issues   Osteoporosis    Overactive bladder    takes Vesicare   PONV (postoperative nausea and vomiting)    Sleep apnea    sleep study done > 77yrs ago;no cpap and since lost weight much improvement   UTI (lower urinary tract infection)    currently taking Cipro   Visual floaters     Past Surgical History:  Procedure Laterality Date   ABDOMINAL HYSTERECTOMY     ANTERIOR CERVICAL DECOMP/DISCECTOMY FUSION N/A 03/18/2013   Procedure: ACDF C4 - C7 3 LEVELS;  Surgeon: Venita Lick, MD;  Location: [facility] OR;  Service: Orthopedics;  Laterality: N/A;   COLONOSCOPY     DILATION AND CURETTAGE OF UTERUS     REPLACEMENT TOTAL KNEE     2019   TONSILLECTOMY     adenoidectomy    Social History   Socioeconomic History   Marital status: Married    Spouse name: Not on file   Number of children: Not on file   Years of education: Not on file   Highest education level: Not on file  Occupational History   Occupation: Retired  Tobacco Use   Smoking status: Never   Smokeless tobacco: Never  Vaping Use   Vaping status: Never Used  Substance and Sexual Activity   Alcohol use: Yes    Alcohol/week: 1.0 - 2.0 standard drink of alcohol    Types: 1 - 2 Glasses of wine per week    Comment: daily   Drug use:  Never   Sexual activity: Not on file  Other Topics Concern   Not on file  Social History Narrative   Not on file   Social Determinants of Health   Financial Resource Strain: Not on file  Food Insecurity: Not on file  Transportation Needs: Not on file  Physical Activity: Not on file  Stress: Not on file  Social Connections: Not on file    Family History  Problem Relation Age of Onset   Heart attack Mother    Hypertension Mother    Diabetes type II Mother    High Cholesterol Mother    Hyperlipidemia Mother    Heart failure Mother    Diabetes type II Father    Lung cancer Father    Hypertension Sister    Vascular Disease Sister    Diabetes type II Sister    COPD Brother    Drug abuse Brother    Drug  abuse Brother    Stomach cancer Brother     Outpatient Encounter Medications as of 01/24/2023  Medication Sig   cholecalciferol (VITAMIN D) 1000 units tablet Take 6,000 Units by mouth daily.   cyanocobalamin (,VITAMIN B-12,) 1000 MCG/ML injection Inject 1,000 mcg into the muscle every 30 (thirty) days.   cyanocobalamin 1000 MCG tablet Take by mouth.   estradiol (ESTRACE) 0.1 MG/GM vaginal cream estradiol 0.01% (0.1 mg/gram) vaginal cream  apply a pea sized AMOUNT VAGINALLY (bladder opening at the vagina) AS DIRECTED NIGHTLY FOR SIX WEEKS, THEN TWICE A WEEK FOR maintenance   glucose blood (ONETOUCH ULTRA) test strip Use as instructed to monitor glucose twice daily   Melatonin-Pyridoxine 10-10 MG TBCR Take by mouth.   nitroGLYCERIN (NITROSTAT) 0.4 MG SL tablet nitroglycerin 0.4 mg sublingual tablet  TABLET 0.4MG  UNDER THE TONGUE EVERY FIVE MINUTES AS NEEDED FOR CHEST PAIN. DO NOT EXCEED THREE DOSES IN 15 MINUTES   Omega-3 1000 MG CAPS Take by mouth.   ramipril (ALTACE) 2.5 MG capsule Take 2.5 mg by mouth daily.   zolpidem (AMBIEN) 10 MG tablet Take 5 mg by mouth at bedtime as needed for sleep.   [DISCONTINUED] glipiZIDE (GLUCOTROL XL) 2.5 MG 24 hr tablet Take 1 tablet (2.5 mg  total) by mouth daily with breakfast.   [DISCONTINUED] glucose blood (ONETOUCH VERIO) test strip 1 each by Other route as needed for other. Use as instructed   [DISCONTINUED] metFORMIN (GLUCOPHAGE-XR) 750 MG 24 hr tablet Take 1 tablet (750 mg total) by mouth daily with breakfast.   ELIQUIS 5 MG TABS tablet Take 5 mg by mouth 2 (two) times daily. (Patient not taking: Reported on 01/24/2023)   glipiZIDE (GLUCOTROL XL) 2.5 MG 24 hr tablet Take 1 tablet (2.5 mg total) by mouth daily with breakfast.   metFORMIN (GLUCOPHAGE-XR) 750 MG 24 hr tablet Take 1 tablet (750 mg total) by mouth daily with breakfast.   [DISCONTINUED] XARELTO 20 MG TABS tablet Take 20 mg by mouth at bedtime.   No facility-administered encounter medications on file as of 01/24/2023.    ALLERGIES: Allergies  Allergen Reactions   Cephalexin Other (See Comments) and Rash    Per patient, may have caused itching or swelling Other reaction(s): Other (See Comments) Pt. States of the face Other Reaction: Other reaction Per patient, may have caused itching or swelling    Codeine Nausea And Vomiting   Nitrofurantoin Rash    Other reaction(s): Other (See Comments) Mouth Ulcers    Macrobid [Nitrofurantoin Macrocrystal] Other (See Comments)    Per patient, may have caused itching or swelling   Sulfamethoxazole-Trimethoprim     Other reaction(s): Other (See Comments) Oral thrush    VACCINATION STATUS:  There is no immunization history on file for this patient.  Diabetes She presents for her follow-up diabetic visit. She has type 2 diabetes mellitus. Onset time: Diagnosed at approx age of 77. Her disease course has been fluctuating. There are no hypoglycemic associated symptoms. Associated symptoms include fatigue. Pertinent negatives for diabetes include no weakness. There are no hypoglycemic complications. Symptoms are stable. There are no diabetic complications. Risk factors for coronary artery disease include diabetes  mellitus, dyslipidemia, family history, hypertension, stress, sedentary lifestyle and post-menopausal. Current diabetic treatment includes oral agent (monotherapy). She is compliant with treatment most of the time. Her weight is fluctuating minimally. She is following a generally unhealthy diet. When asked about meal planning, she reported none. She has not had a previous visit with a dietitian. She rarely  participates in exercise. Her breakfast blood glucose range is generally >200 mg/dl. Her bedtime blood glucose range is generally 140-180 mg/dl. (She presents today with her meter and logs, showing above target glycemic profile overall.  Her POCT A1c today is 7.6%, increasing from last visit of 7.5% (says her PCP recently checked A1c and it was 7.9%).  She denies any hypoglycemia.  She admits she has been eating snacks before bed (after checking glucose at night).) An ACE inhibitor/angiotensin II receptor blocker is being taken. She does not see a podiatrist.Eye exam is current.     Review of systems  Constitutional: + Minimally fluctuating body weight, current Body mass index is 29.97 kg/m., + fatigue- says these come in waves, + subjective hyperthermia (she thinks related to her blood thinner as it started shortly after), no subjective hypothermia Eyes: no blurry vision, no xerophthalmia ENT: no sore throat, no nodules palpated in throat, no dysphagia/odynophagia, no hoarseness Cardiovascular: no chest pain, no shortness of breath, no palpitations, no leg swelling Respiratory: no cough, no shortness of breath Gastrointestinal: + intermittent nausea/vomiting/diarrhea Musculoskeletal: no muscle/joint aches Skin: no rashes, no hyperemia Neurological: no tremors, no numbness, no tingling, + intermittent dizziness Psychiatric: + depression, + anxiety, + insomnia  Objective:     BP 122/65 (BP Location: Left Arm, Patient Position: Sitting, Cuff Size: Large)   Pulse (!) 53   Ht 5\' 3"  (1.6 m)   Wt  169 lb 3.2 oz (76.7 kg)   BMI 29.97 kg/m   Wt Readings from Last 3 Encounters:  01/24/23 169 lb 3.2 oz (76.7 kg)  12/12/22 170 lb 1.6 oz (77.2 kg)  11/28/22 171 lb 3.2 oz (77.7 kg)     BP Readings from Last 3 Encounters:  01/24/23 122/65  12/12/22 (!) 148/71  11/28/22 (!) 159/62      Physical Exam- Limited  Constitutional:  Body mass index is 29.97 kg/m. , not in acute distress, normal state of mind Eyes:  EOMI, no exophthalmos Musculoskeletal: no gross deformities, strength intact in all four extremities, no gross restriction of joint movements Skin:  no rashes, no hyperemia Neurological: no tremor with outstretched hands   Diabetic Foot Exam - Simple   No data filed     CMP ( most recent) CMP     Component Value Date/Time   BUN 21 12/17/2021 0000   CREATININE 0.7 12/17/2021 0000     Diabetic Labs (most recent): Lab Results  Component Value Date   HGBA1C 7.6 (A) 01/24/2023   HGBA1C 7.5 (A) 09/12/2022   HGBA1C 7.2 (A) 05/14/2022   MICROALBUR 0.9 12/17/2021   MICROALBUR 10 07/31/2021     Lipid Panel ( most recent) Lipid Panel     Component Value Date/Time   TRIG 145 12/17/2021 0000   LDLCALC 110 12/17/2021 0000      Lab Results  Component Value Date   TSH 1.510 07/17/2021   FREET4 1.05 07/17/2021           Assessment & Plan:   1) Type 2 diabetes mellitus without complication, without long-term current use of insulin (HCC)  She presents today with her meter and logs, showing above target glycemic profile overall.  Her POCT A1c today is 7.6%, increasing from last visit of 7.5% (says her PCP recently checked A1c and it was 7.9%).  She denies any hypoglycemia.  She admits she has been eating snacks before bed (after checking glucose at night).  - Vickie Ward has currently uncontrolled symptomatic type 2 DM  since 77 years of age.   -Recent labs reviewed.  - I had a long discussion with her about the progressive nature of diabetes and the  pathology behind its complications. -her diabetes is not currently complicated but she remains at a high risk for more acute and chronic complications which include CAD, CVA, CKD, retinopathy, and neuropathy. These are all discussed in detail with her.  The following Lifestyle Medicine recommendations according to American College of Lifestyle Medicine [facility]) were discussed and offered to patient and she agrees to start the journey:  A. Whole Foods, Plant-based plate comprising of fruits and vegetables, plant-based proteins, whole-grain carbohydrates was discussed in detail with the patient.   A list for source of those nutrients were also provided to the patient.  Patient will use only water or unsweetened tea for hydration. B.  The need to stay away from risky substances including alcohol, smoking; obtaining 7 to 9 hours of restorative sleep, at least 150 minutes of moderate intensity exercise weekly, the importance of healthy social connections,  and stress reduction techniques were discussed. C.  A full color page of  Calorie density of various food groups per pound showing examples of each food groups was provided to the patient.  - Nutritional counseling repeated at each appointment due to patients tendency to fall back in to old habits.  - The patient admits there is a room for improvement in their diet and drink choices. -  Suggestion is made for the patient to avoid simple carbohydrates from their diet including Cakes, Sweet Desserts / Pastries, Ice Cream, Soda (diet and regular), Sweet Tea, Candies, Chips, Cookies, Sweet Pastries, Store Bought Juices, Alcohol in Excess of 1-2 drinks a day, Artificial Sweeteners, Coffee Creamer, and "Sugar-free" Products. This will help patient to have stable blood glucose profile and potentially avoid unintended weight gain.   - I encouraged the patient to switch to unprocessed or minimally processed complex starch and increased protein intake (animal or plant  source), fruits, and vegetables.   - Patient is advised to stick to a routine mealtimes to eat 3 meals a day and avoid unnecessary snacks (to snack only to correct hypoglycemia).  - she is being followed by Norm Salt, RDN, CDE for diabetes education- had an appointment with her today.  - I have approached her with the following individualized plan to manage her diabetes and patient agrees:   -She is advised to continue her Metformin 750 mg ER po daily with breakfast and Glipizide 2.5 mg XL daily with breakfast (may look to increase at next visit).  She has tolerated it well despite known allergy to sulfa meds.  We discussed the importance of NOT SNACKING before bed.  -she is encouraged to continue monitoring blood glucose at least twice daily daily, before breakfast and before bed, and to call the clinic if she has readings less than 70 or above 300 for 3 tests in a row.    - Adjustment parameters are given to her for hypo and hyperglycemia in writing.  - she will be considered for incretin therapy as appropriate next visit, although given her significant GI symptoms, she may not tolerate.  - Specific targets for  A1c; LDL, HDL, and Triglycerides were discussed with the patient.  2) Blood Pressure /Hypertension:  her blood pressure is controlled to target for her age.   she is advised to continue her current medications including Ramipril 2.5 mg p.o. daily with breakfast.  3) Lipids/Hyperlipidemia:    Review of  her recent lipid panel from 06/27/22 showed uncontrolled LDL at 97 and elevated triglycerides of 178.  She is not currently on any lipid lowering medications.    4)  Weight/Diet:  her Body mass index is 29.97 kg/m.  -  clearly complicating her diabetes care.   she is a candidate for weight loss. I discussed with her the fact that loss of 5 - 10% of her  current body weight will have the most impact on her diabetes management.  Exercise, and detailed carbohydrates information  provided  -  detailed on discharge instructions.  5) Chronic Care/Health Maintenance: -she is on ACEI/ARB and not on Statin medications and is encouraged to initiate and continue to follow up with Ophthalmology, Dentist, Podiatrist at least yearly or according to recommendations, and advised to stay away from smoking. I have recommended yearly flu vaccine and pneumonia vaccine at least every 5 years; moderate intensity exercise for up to 150 minutes weekly; and sleep for at least 7 hours a day.  - she is advised to maintain close follow up with Richarda Blade E for primary care needs, as well as her other providers for optimal and coordinated care.    I spent  41  minutes in the care of the patient today including review of labs from CMP, Lipids, Thyroid Function, Hematology (current and previous including abstractions from other facilities); face-to-face time discussing  her blood glucose readings/logs, discussing hypoglycemia and hyperglycemia episodes and symptoms, medications doses, her options of short and long term treatment based on the latest standards of care / guidelines;  discussion about incorporating lifestyle medicine;  and documenting the encounter. Risk reduction counseling performed per USPSTF guidelines to reduce obesity and cardiovascular risk factors.     Please refer to Patient Instructions for Blood Glucose Monitoring and Insulin/Medications Dosing Guide"  in media tab for additional information. Please  also refer to " Patient Self Inventory" in the Media  tab for reviewed elements of pertinent patient history.  Vickie Ward participated in the discussions, expressed understanding, and voiced agreement with the above plans.  All questions were answered to her satisfaction. she is encouraged to contact clinic should she have any questions or concerns prior to her return visit.     Follow up plan: - Return in about 3 months (around 04/25/2023) for Diabetes F/U with A1c in  office, No previsit labs, Bring meter and logs.   Ronny Bacon, [facility] [address] [place], [place] [address] Phone: [phone] Fax: [phone]  01/24/2023, 9:01 AM

## 2023-02-04 ENCOUNTER — Encounter (HOSPITAL_COMMUNITY): Payer: Self-pay

## 2023-02-04 NOTE — Patient Instructions (Addendum)
SURGICAL WAITING ROOM VISITATION Patients having surgery or a procedure may have no more than 2 support people in the waiting area - these visitors may rotate.    Children under the age of 20 must have an adult with them who is not the patient.  If the patient needs to stay at the hospital during part of their recovery, the visitor guidelines for inpatient rooms apply. Pre-op nurse will coordinate an appropriate time for 1 support person to accompany patient in pre-op.  This support person may not rotate.    Please refer to the [facility] website for the visitor guidelines for Inpatients (after your surgery is over and you are in a regular room).       Your procedure is scheduled on: 02-19-23   Report to [facility] Main Entrance    Report to admitting at 7:55 AM   Call this number if you have problems the morning of surgery [phone]   Do not eat food or drink liquids :After Midnight.           If you have questions, please contact your surgeon's office.   FOLLOW  ANY ADDITIONAL PRE OP INSTRUCTIONS YOU RECEIVED FROM YOUR SURGEON'S OFFICE!!!     Oral Hygiene is also important to reduce your risk of infection.                                    Remember - BRUSH YOUR TEETH THE MORNING OF SURGERY WITH YOUR REGULAR TOOTHPASTE   Do NOT smoke after Midnight   Take these medicines the morning of surgery with A SIP OF WATER:   Oxybutynin  Rosuvastatin  Stop all vitamins and herbal supplements 7 days before surgery  How to Manage Your Diabetes Before and After Surgery  Why is it important to control my blood sugar before and after surgery? Improving blood sugar levels before and after surgery helps healing and can limit problems. A way of improving blood sugar control is eating a healthy diet by:  Eating less sugar and carbohydrates  Increasing activity/exercise  Talking with your doctor about reaching your blood sugar goals High blood sugars (greater than 180  mg/dL) can raise your risk of infections and slow your recovery, so you will need to focus on controlling your diabetes during the weeks before surgery. Make sure that the doctor who takes care of your diabetes knows about your planned surgery including the date and location.  How do I manage my blood sugar before surgery? Check your blood sugar at least 4 times a day, starting 2 days before surgery, to make sure that the level is not too high or low. Check your blood sugar the morning of your surgery when you wake up and every 2 hours until you get to the Short Stay unit. If your blood sugar is less than 70 mg/dL, you will need to treat for low blood sugar: Do not take insulin. Treat a low blood sugar (less than 70 mg/dL) with  cup of clear juice (cranberry or apple), 4 glucose tablets, OR glucose gel. Recheck blood sugar in 15 minutes after treatment (to make sure it is greater than 70 mg/dL). If your blood sugar is not greater than 70 mg/dL on recheck, call [phone] for further instructions. Report your blood sugar to the short stay nurse when you get to Short Stay.  If you are admitted to the hospital after surgery:  Your blood sugar will be checked by the staff and you will probably be given insulin after surgery (instead of oral diabetes medicines) to make sure you have good blood sugar levels. The goal for blood sugar control after surgery is 80-180 mg/dL.   WHAT DO I DO ABOUT MY DIABETES MEDICATION?  Do not take oral diabetes medicines (pills) the morning of surgery (do not take Glipizide or Metformin)   DO NOT TAKE THE FOLLOWING 7 DAYS PRIOR TO SURGERY: Ozempic, Wegovy, Rybelsus (Semaglutide), Byetta (exenatide), Bydureon (exenatide ER), Victoza, Saxenda (liraglutide), or Trulicity (dulaglutide) Mounjaro (Tirzepatide) Adlyxin (Lixisenatide), Polyethylene Glycol Loxenatide.  Reviewed and Endorsed by [facility] Patient Education Committee, August 2015                               You may not have any metal on your body including hair pins, jewelry, and body piercing             Do not wear make-up, lotions, powders, perfumes or deodorant  Do not wear nail polish including gel and S&S, artificial/acrylic nails, or any other type of covering on natural nails including finger and toenails. If you have artificial nails, gel coating, etc. that needs to be removed by a nail salon please have this removed prior to surgery or surgery may need to be canceled/ delayed if the surgeon/ anesthesia feels like they are unable to be safely monitored.   Do not shave  48 hours prior to surgery.         Do not bring valuables to the hospital. [facility] IS NOT RESPONSIBLE   FOR VALUABLES.   Contacts, dentures or bridgework may not be worn into surgery.   Bring small overnight bag day of surgery.   DO NOT BRING YOUR HOME MEDICATIONS TO THE HOSPITAL. PHARMACY WILL DISPENSE MEDICATIONS LISTED ON YOUR MEDICATION LIST TO YOU DURING YOUR ADMISSION IN THE HOSPITAL!     Special Instructions: Bring a copy of your healthcare power of attorney and living will documents the day of surgery if you haven't scanned them before.              Please read over the following fact sheets you were given: IF YOU HAVE QUESTIONS ABOUT YOUR PRE-OP INSTRUCTIONS PLEASE CALL [phone] Gwen  If you received a COVID test during your pre-op visit  it is requested that you wear a mask when out in public, stay away from anyone that may not be feeling well and notify your surgeon if you develop symptoms. If you test positive for Covid or have been in contact with anyone that has tested positive in the last 10 days please notify you surgeon.    Pre-operative 5 CHG Bath Instructions   You can play a key role in reducing the risk of infection after surgery. Your skin needs to be as free of germs as possible. You can reduce the number of germs on your skin by washing with CHG (chlorhexidine gluconate) soap before  surgery. CHG is an antiseptic soap that kills germs and continues to kill germs even after washing.   DO NOT use if you have an allergy to chlorhexidine/CHG or antibacterial soaps. If your skin becomes reddened or irritated, stop using the CHG and notify one of our RNs at [phone].   Please shower with the CHG soap starting 4 days before surgery using the following schedule:     Please keep in mind  the following:  DO NOT shave, including legs and underarms, starting the day of your first shower.   You may shave your face at any point before/day of surgery.  Place clean sheets on your bed the day you start using CHG soap. Use a clean washcloth (not used since being washed) for each shower. DO NOT sleep with pets once you start using the CHG.   CHG Shower Instructions:  If you choose to wash your hair and private area, wash first with your normal shampoo/soap.  After you use shampoo/soap, rinse your hair and body thoroughly to remove shampoo/soap residue.  Turn the water OFF and apply about 3 tablespoons (45 ml) of CHG soap to a CLEAN washcloth.  Apply CHG soap ONLY FROM YOUR NECK DOWN TO YOUR TOES (washing for 3-5 minutes)  DO NOT use CHG soap on face, private areas, open wounds, or sores.  Pay special attention to the area where your surgery is being performed.  If you are having back surgery, having someone wash your back for you may be helpful. Wait 2 minutes after CHG soap is applied, then you may rinse off the CHG soap.  Pat dry with a clean towel  Put on clean clothes/pajamas   If you choose to wear lotion, please use ONLY the CHG-compatible lotions on the back of this paper.     Additional instructions for the day of surgery: DO NOT APPLY any lotions, deodorants, cologne, or perfumes.   Put on clean/comfortable clothes.  Brush your teeth.  Ask your nurse before applying any prescription medications to the skin.      CHG Compatible Lotions   Aveeno Moisturizing  lotion  Cetaphil Moisturizing Cream  Cetaphil Moisturizing Lotion  Clairol Herbal Essence Moisturizing Lotion, Dry Skin  Clairol Herbal Essence Moisturizing Lotion, Extra Dry Skin  Clairol Herbal Essence Moisturizing Lotion, Normal Skin  Curel Age Defying Therapeutic Moisturizing Lotion with Alpha Hydroxy  Curel Extreme Care Body Lotion  Curel Soothing Hands Moisturizing Hand Lotion  Curel Therapeutic Moisturizing Cream, Fragrance-Free  Curel Therapeutic Moisturizing Lotion, Fragrance-Free  Curel Therapeutic Moisturizing Lotion, Original Formula  Eucerin Daily Replenishing Lotion  Eucerin Dry Skin Therapy Plus Alpha Hydroxy Crme  Eucerin Dry Skin Therapy Plus Alpha Hydroxy Lotion  Eucerin Original Crme  Eucerin Original Lotion  Eucerin Plus Crme Eucerin Plus Lotion  Eucerin TriLipid Replenishing Lotion  Keri Anti-Bacterial Hand Lotion  Keri Deep Conditioning Original Lotion Dry Skin Formula Softly Scented  Keri Deep Conditioning Original Lotion, Fragrance Free Sensitive Skin Formula  Keri Lotion Fast Absorbing Fragrance Free Sensitive Skin Formula  Keri Lotion Fast Absorbing Softly Scented Dry Skin Formula  Keri Original Lotion  Keri Skin Renewal Lotion Keri Silky Smooth Lotion  Keri Silky Smooth Sensitive Skin Lotion  Nivea Body Creamy Conditioning Oil  Nivea Body Extra Enriched Lotion  Nivea Body Original Lotion  Nivea Body Sheer Moisturizing Lotion Nivea Crme  Nivea Skin Firming Lotion  NutraDerm 30 Skin Lotion  NutraDerm Skin Lotion  NutraDerm Therapeutic Skin Cream  NutraDerm Therapeutic Skin Lotion  ProShield Protective Hand Cream  Provon moisturizing lotion   PATIENT SIGNATURE_________________________________  NURSE SIGNATURE__________________________________  ________________________________________________________________________    Rogelia Mire  An incentive spirometer is a tool that can help keep your lungs clear and active. This tool measures  how well you are filling your lungs with each breath. Taking long deep breaths may help reverse or decrease the chance of developing breathing (pulmonary) problems (especially infection) following: A long period of time when you  are unable to move or be active. BEFORE THE PROCEDURE  If the spirometer includes an indicator to show your best effort, your nurse or respiratory therapist will set it to a desired goal. If possible, sit up straight or lean slightly forward. Try not to slouch. Hold the incentive spirometer in an upright position. INSTRUCTIONS FOR USE  Sit on the edge of your bed if possible, or sit up as far as you can in bed or on a chair. Hold the incentive spirometer in an upright position. Breathe out normally. Place the mouthpiece in your mouth and seal your lips tightly around it. Breathe in slowly and as deeply as possible, raising the piston or the ball toward the top of the column. Hold your breath for 3-5 seconds or for as long as possible. Allow the piston or ball to fall to the bottom of the column. Remove the mouthpiece from your mouth and breathe out normally. Rest for a few seconds and repeat Steps 1 through 7 at least 10 times every 1-2 hours when you are awake. Take your time and take a few normal breaths between deep breaths. The spirometer may include an indicator to show your best effort. Use the indicator as a goal to work toward during each repetition. After each set of 10 deep breaths, practice coughing to be sure your lungs are clear. If you have an incision (the cut made at the time of surgery), support your incision when coughing by placing a pillow or rolled up towels firmly against it. Once you are able to get out of bed, walk around indoors and cough well. You may stop using the incentive spirometer when instructed by your caregiver.  RISKS AND COMPLICATIONS Take your time so you do not get dizzy or light-headed. If you are in pain, you may need to take or  ask for pain medication before doing incentive spirometry. It is harder to take a deep breath if you are having pain. AFTER USE Rest and breathe slowly and easily. It can be helpful to keep track of a log of your progress. Your caregiver can provide you with a simple table to help with this. If you are using the spirometer at home, follow these instructions: SEEK MEDICAL CARE IF:  You are having difficultly using the spirometer. You have trouble using the spirometer as often as instructed. Your pain medication is not giving enough relief while using the spirometer. You develop fever of 100.5 F (38.1 C) or higher. SEEK IMMEDIATE MEDICAL CARE IF:  You cough up bloody sputum that had not been present before. You develop fever of 102 F (38.9 C) or greater. You develop worsening pain at or near the incision site. MAKE SURE YOU:  Understand these instructions. Will watch your condition. Will get help right away if you are not doing well or get worse. Document Released: 08/26/2006 Document Revised: 07/08/2011 Document Reviewed: 10/27/2006 [facility] Patient Information 2014 [place], [place].   ________________________________________________________________________

## 2023-02-06 NOTE — Progress Notes (Addendum)
COVID Vaccine Completed:  Date of COVID positive in last 90 days:  No  PCP - Richarda Blade, FNP (notes requested)  Cardiologist - Dr. Hyacinth Meeker in [place] (office note on chart)  Oncologist - Doreatha Massed, MD  Cardiac clearance on chart dated 01-23-23  Chest x-ray - N/A EKG - 07-15-22 at cardiologist, copy on chart Stress Test - yes, copy on chart ECHO - 10-11-20 Cardiac Cath - N/A Pacemaker/ICD device last checked: Spinal Cord Stimulator:  N/A  Bowel Prep - N/A  Sleep Study -  Yes, +sleep apnea CPAP - No  Fasting Blood Sugar - 160 to 200 Checks Blood Sugar - 2  times a day  Last dose of GLP1 agonist-  N/A GLP1 instructions:  N/A   Last dose of SGLT-2 inhibitors-  N/A SGLT-2 instructions: N/A  Blood Thinner Instructions: Eliquis  Stopped two months ago  Aspirin Instructions:  ASA 81.  Per patient to stop a week before surgery  Last Dose:  Activity level:  Can go up a flight of stairs and perform activities of daily living without stopping and without symptoms of chest pain or shortness of breath.  Anesthesia review:  Factor V Leiden, Afib, RBBB  A1c 7.6 on 01-24-23  Patient denies shortness of breath, fever, cough and chest pain at PAT appointment  Patient verbalized understanding of instructions that were given to them at the PAT appointment. Patient was also instructed that they will need to review over the PAT instructions again at home before surgery.

## 2023-02-07 ENCOUNTER — Encounter (HOSPITAL_COMMUNITY): Payer: Self-pay

## 2023-02-07 ENCOUNTER — Other Ambulatory Visit: Payer: Self-pay

## 2023-02-07 ENCOUNTER — Encounter (HOSPITAL_COMMUNITY)
Admission: RE | Admit: 2023-02-07 | Discharge: 2023-02-07 | Disposition: A | Discharge status: Home / Self Care | Payer: Medicare Other | Source: Ambulatory Visit | Attending: Specialist | Admitting: Specialist

## 2023-02-07 VITALS — BP 159/87 | HR 57 | Temp 98.1°F | Resp 14 | Ht 63.0 in | Wt 171.4 lb

## 2023-02-07 DIAGNOSIS — Z01812 Encounter for preprocedural laboratory examination: Secondary | ICD-10-CM | POA: Insufficient documentation

## 2023-02-07 DIAGNOSIS — Z01818 Encounter for other preprocedural examination: Secondary | ICD-10-CM | POA: Diagnosis present

## 2023-02-07 DIAGNOSIS — D649 Anemia, unspecified: Secondary | ICD-10-CM | POA: Insufficient documentation

## 2023-02-07 DIAGNOSIS — E119 Type 2 diabetes mellitus without complications: Secondary | ICD-10-CM | POA: Diagnosis not present

## 2023-02-07 HISTORY — DX: Unspecified right bundle-branch block: I45.10

## 2023-02-07 HISTORY — DX: Depression, unspecified: F32.A

## 2023-02-07 HISTORY — DX: Unspecified atrial fibrillation: I48.91

## 2023-02-07 HISTORY — DX: Anxiety disorder, unspecified: F41.9

## 2023-02-07 LAB — BASIC METABOLIC PANEL
Anion gap: 10 (ref 5–15)
BUN: 20 mg/dL (ref 8–23)
CO2: 24 mmol/L (ref 22–32)
Calcium: 9.2 mg/dL (ref 8.9–10.3)
Chloride: 103 mmol/L (ref 98–111)
Creatinine, Ser: 0.65 mg/dL (ref 0.44–1.00)
GFR, Estimated: >60 mL/min (ref >60)
Glucose, Bld: 152 mg/dL — ABNORMAL HIGH (ref 70–99)
Potassium: 4.1 mmol/L (ref 3.5–5.1)
Sodium: 137 mmol/L (ref 135–145)

## 2023-02-07 LAB — CBC
HCT: 34.9 % — ABNORMAL LOW (ref 36.0–46.0)
Hemoglobin: 10.4 g/dL — ABNORMAL LOW (ref 12.0–15.0)
MCH: 23 pg — ABNORMAL LOW (ref 26.0–34.0)
MCHC: 29.8 g/dL — ABNORMAL LOW (ref 30.0–36.0)
MCV: 77.2 fL — ABNORMAL LOW (ref 80.0–100.0)
Platelets: 264 10*3/uL (ref 150–400)
RBC: 4.52 MIL/uL (ref 3.87–5.11)
RDW: 19 % — ABNORMAL HIGH (ref 11.5–15.5)
WBC: 7.6 10*3/uL (ref 4.0–10.5)
nRBC: 0 % (ref 0.0–0.2)

## 2023-02-07 LAB — GLUCOSE, CAPILLARY: Glucose-Capillary: 160 mg/dL — ABNORMAL HIGH (ref 70–99)

## 2023-02-07 LAB — SURGICAL PCR SCREEN
MRSA, PCR: NEGATIVE
Staphylococcus aureus: POSITIVE — AB

## 2023-02-07 NOTE — Progress Notes (Signed)
PCR results sent to Dr. Tonita Cong to review.

## 2023-02-10 ENCOUNTER — Ambulatory Visit: Payer: Self-pay | Admitting: Orthopedic Surgery

## 2023-02-10 NOTE — H&P (Signed)
Vickie Ward is an 77 y.o. female.   Chief Complaint: right knee pain HPI: H&P right TKA 02/19/23 @ [facility].  Dr. Shelle Iron and the patient mutually agreed to proceed with a total knee replacement. Risks and benefits of the procedure were discussed including stiffness, suboptimal range of motion, persistent pain, infection requiring removal of prosthesis and reinsertion, need for prophylactic antibiotics in the future, for example, dental procedures, possible need for manipulation, revision in the future and also anesthetic complications including DVT, PE, etc. We discussed the perioperative course, time in the hospital, postoperative recovery and the need for elevation to control swelling. We also discussed the predicted range of motion and the probability that squatting and kneeling would be unobtainable in the future. In addition, postoperative anticoagulation was discussed. We have obtained preoperative medical clearance as necessary. Provided illustrated handout and discussed it in detail. They will enroll in the total joint replacement educational forum at the hospital.  She has a walker, cane and shower chair. Her husband underwent TKA with Dr. Shelle Iron.  Past Medical History:  Diagnosis Date   Anemia    Anxiety    Arthritis    Cervical myelopathy (HCC) 02/04/2018   Depression    Diabetes mellitus without complication (HCC)    Factor V Leiden (HCC)    GERD (gastroesophageal reflux disease)    pt takes OTC meds   Headache(784.0)    Insomnia    Numbness and tingling    in L arm from neck issues   Osteoporosis    Overactive bladder    takes Vesicare   PONV (postoperative nausea and vomiting)    Sleep apnea    sleep study done > 58yrs ago;no cpap and since lost weight much improvement   UTI (lower urinary tract infection)    currently taking Cipro   Visual floaters     Past Surgical History:  Procedure Laterality Date   ABDOMINAL HYSTERECTOMY     ANTERIOR CERVICAL DECOMP/DISCECTOMY  FUSION N/A 03/18/2013   Procedure: ACDF C4 - C7 3 LEVELS;  Surgeon: Venita Lick, MD;  Location: [facility] OR;  Service: Orthopedics;  Laterality: N/A;   COLONOSCOPY     DILATION AND CURETTAGE OF UTERUS     REPLACEMENT TOTAL KNEE     2019   TONSILLECTOMY     adenoidectomy   UPPER GI ENDOSCOPY      Family History  Problem Relation Age of Onset   Heart attack Mother    Hypertension Mother    Diabetes type II Mother    High Cholesterol Mother    Hyperlipidemia Mother    Heart failure Mother    Diabetes type II Father    Lung cancer Father    Hypertension Sister    Vascular Disease Sister    Diabetes type II Sister    COPD Brother    Drug abuse Brother    Drug abuse Brother    Stomach cancer Brother    Social History:  reports that she has never smoked. She has never used smokeless tobacco. She reports current alcohol use of about 1.0 - 2.0 standard drink of alcohol per week. She reports that she does not use drugs.  Allergies:  Allergies  Allergen Reactions   Cephalexin Other (See Comments) and Rash    Per patient, may have caused itching or swelling Other reaction(s): Other (See Comments) Pt. States of the face Other Reaction: Other reaction Per patient, may have caused itching or swelling    Codeine Nausea And Vomiting  Nitrofurantoin Rash    Other reaction(s): Other (See Comments) Mouth Ulcers    Macrobid [Nitrofurantoin Macrocrystal] Other (See Comments)    Per patient, may have caused itching or swelling   Sulfamethoxazole-Trimethoprim     Other reaction(s): Other (See Comments) Oral thrush   Current meds: amoxicillin 500 mg capsule cyanocobalamin (vit B-12) 1,000 mcg/mL injection solution estradioL 0.01% (0.1 mg/gram) vaginal cream glipiZIDE ER 2.5 mg tablet, extended release 24 hr levoFLOXacin 500 mg tablet metFORMIN ER 750 mg tablet,extended release 24 hr nitroglycerin 0.4 mg sublingual tablet OneTouch Ultra Blue Test Strip oxyBUTYnin chloride ER 5 mg  tablet,extended release 24 hr ramipriL 2.5 mg capsule rosuvastatin 5 mg tablet zolpidem 10 mg tablet  Review of Systems  Constitutional: Negative.   HENT: Negative.    Eyes: Negative.   Respiratory: Negative.    Cardiovascular: Negative.   Gastrointestinal: Negative.   Endocrine: Negative.   Genitourinary: Negative.   Musculoskeletal:  Positive for arthralgias, gait problem, joint swelling and myalgias.  Skin: Negative.   Psychiatric/Behavioral: Negative.      There were no vitals taken for this visit. Physical Exam Constitutional:      Appearance: Normal appearance.  HENT:     Head: Normocephalic and atraumatic.     Right Ear: External ear normal.     Left Ear: External ear normal.     Nose: Nose normal.     Mouth/Throat:     Pharynx: Oropharynx is clear.  Eyes:     Conjunctiva/sclera: Conjunctivae normal.  Cardiovascular:     Rate and Rhythm: Normal rate and regular rhythm.     Pulses: Normal pulses.     Heart sounds: Normal heart sounds.  Pulmonary:     Effort: Pulmonary effort is normal.     Breath sounds: Normal breath sounds.  Abdominal:     General: Bowel sounds are normal.  Musculoskeletal:     Cervical back: Normal range of motion and neck supple.     Comments: She walks an antalgic gait she is tender in the medial joint line she has a varus thrust. Range of motion 0-1 40 she has patellofemoral pain compression. No DVT ipsilateral hip and ankle exam is unremarkable.  Skin:    General: Skin is warm and dry.  Neurological:     Mental Status: She is alert.    X-rays of the knee demonstrate end-stage osteoarthrosis medial compartment with a varus deformity of the right knee. Left knee has a well-placed prosthesis without evidence of loosening.  Assessment/Plan Impression: Right knee end-stage DJD  Plan: Pt with end-stage right knee DJD, bone-on-bone, refractory to conservative tx, scheduled for right total knee replacement by Dr. Shelle Iron on 02/19/23. We again  discussed the procedure itself as well as risks, complications and alternatives, including but not limited to DVT, PE, infx, bleeding, failure of procedure, need for secondary procedure including manipulation, nerve injury, ongoing pain/symptoms, anesthesia risk, even stroke or death. Also discussed typical post-op protocols, activity restrictions, need for PT, flexion/extension exercises, time out of work. Discussed need for DVT ppx post-op per protocol. Discussed dental ppx and infx prevention. Also discussed limitations post-operatively such as kneeling and squatting. All questions were answered. Patient desires to proceed with surgery as scheduled.  Will hold supplements, ASA and NSAIDs accordingly. Will remain NPO after midnight the night before surgery. Will present to [facility] for pre-op testing. Will use vanco and gent given her keflex allergy. Anticipate hospital stay to include at least 2 midnights given medical history and to ensure  proper pain control. Plan Xarelto for DVT ppx post-op. Plan Oxycodone, Robaxin, Colace, Miralax. Plan home with HHPT post-op with family members at home for assistance, arrangements made. Will follow up 10-14 days post-op for suture removal and xrays.  Plan right total knee replacement   Dorothy Spark, PA-C for Dr Shelle Iron 02/10/2023, 4:24 PM

## 2023-02-10 NOTE — H&P (View-Only) (Signed)
Vickie Ward is an 77 y.o. female.   Chief Complaint: right knee pain HPI: H&P right TKA 02/19/23 @ [facility].  Dr. Shelle Iron and the patient mutually agreed to proceed with a total knee replacement. Risks and benefits of the procedure were discussed including stiffness, suboptimal range of motion, persistent pain, infection requiring removal of prosthesis and reinsertion, need for prophylactic antibiotics in the future, for example, dental procedures, possible need for manipulation, revision in the future and also anesthetic complications including DVT, PE, etc. We discussed the perioperative course, time in the hospital, postoperative recovery and the need for elevation to control swelling. We also discussed the predicted range of motion and the probability that squatting and kneeling would be unobtainable in the future. In addition, postoperative anticoagulation was discussed. We have obtained preoperative medical clearance as necessary. Provided illustrated handout and discussed it in detail. They will enroll in the total joint replacement educational forum at the hospital.  She has a walker, cane and shower chair. Her husband underwent TKA with Dr. Shelle Iron.  Past Medical History:  Diagnosis Date   Anemia    Anxiety    Arthritis    Cervical myelopathy (HCC) 02/04/2018   Depression    Diabetes mellitus without complication (HCC)    Factor V Leiden (HCC)    GERD (gastroesophageal reflux disease)    pt takes OTC meds   Headache(784.0)    Insomnia    Numbness and tingling    in L arm from neck issues   Osteoporosis    Overactive bladder    takes Vesicare   PONV (postoperative nausea and vomiting)    Sleep apnea    sleep study done > 58yrs ago;no cpap and since lost weight much improvement   UTI (lower urinary tract infection)    currently taking Cipro   Visual floaters     Past Surgical History:  Procedure Laterality Date   ABDOMINAL HYSTERECTOMY     ANTERIOR CERVICAL DECOMP/DISCECTOMY  FUSION N/A 03/18/2013   Procedure: ACDF C4 - C7 3 LEVELS;  Surgeon: Venita Lick, MD;  Location: [facility] OR;  Service: Orthopedics;  Laterality: N/A;   COLONOSCOPY     DILATION AND CURETTAGE OF UTERUS     REPLACEMENT TOTAL KNEE     2019   TONSILLECTOMY     adenoidectomy   UPPER GI ENDOSCOPY      Family History  Problem Relation Age of Onset   Heart attack Mother    Hypertension Mother    Diabetes type II Mother    High Cholesterol Mother    Hyperlipidemia Mother    Heart failure Mother    Diabetes type II Father    Lung cancer Father    Hypertension Sister    Vascular Disease Sister    Diabetes type II Sister    COPD Brother    Drug abuse Brother    Drug abuse Brother    Stomach cancer Brother    Social History:  reports that she has never smoked. She has never used smokeless tobacco. She reports current alcohol use of about 1.0 - 2.0 standard drink of alcohol per week. She reports that she does not use drugs.  Allergies:  Allergies  Allergen Reactions   Cephalexin Other (See Comments) and Rash    Per patient, may have caused itching or swelling Other reaction(s): Other (See Comments) Pt. States of the face Other Reaction: Other reaction Per patient, may have caused itching or swelling    Codeine Nausea And Vomiting  Nitrofurantoin Rash    Other reaction(s): Other (See Comments) Mouth Ulcers    Macrobid [Nitrofurantoin Macrocrystal] Other (See Comments)    Per patient, may have caused itching or swelling   Sulfamethoxazole-Trimethoprim     Other reaction(s): Other (See Comments) Oral thrush   Current meds: amoxicillin 500 mg capsule cyanocobalamin (vit B-12) 1,000 mcg/mL injection solution estradioL 0.01% (0.1 mg/gram) vaginal cream glipiZIDE ER 2.5 mg tablet, extended release 24 hr levoFLOXacin 500 mg tablet metFORMIN ER 750 mg tablet,extended release 24 hr nitroglycerin 0.4 mg sublingual tablet OneTouch Ultra Blue Test Strip oxyBUTYnin chloride ER 5 mg  tablet,extended release 24 hr ramipriL 2.5 mg capsule rosuvastatin 5 mg tablet zolpidem 10 mg tablet  Review of Systems  Constitutional: Negative.   HENT: Negative.    Eyes: Negative.   Respiratory: Negative.    Cardiovascular: Negative.   Gastrointestinal: Negative.   Endocrine: Negative.   Genitourinary: Negative.   Musculoskeletal:  Positive for arthralgias, gait problem, joint swelling and myalgias.  Skin: Negative.   Psychiatric/Behavioral: Negative.      There were no vitals taken for this visit. Physical Exam Constitutional:      Appearance: Normal appearance.  HENT:     Head: Normocephalic and atraumatic.     Right Ear: External ear normal.     Left Ear: External ear normal.     Nose: Nose normal.     Mouth/Throat:     Pharynx: Oropharynx is clear.  Eyes:     Conjunctiva/sclera: Conjunctivae normal.  Cardiovascular:     Rate and Rhythm: Normal rate and regular rhythm.     Pulses: Normal pulses.     Heart sounds: Normal heart sounds.  Pulmonary:     Effort: Pulmonary effort is normal.     Breath sounds: Normal breath sounds.  Abdominal:     General: Bowel sounds are normal.  Musculoskeletal:     Cervical back: Normal range of motion and neck supple.     Comments: She walks an antalgic gait she is tender in the medial joint line she has a varus thrust. Range of motion 0-1 40 she has patellofemoral pain compression. No DVT ipsilateral hip and ankle exam is unremarkable.  Skin:    General: Skin is warm and dry.  Neurological:     Mental Status: She is alert.    X-rays of the knee demonstrate end-stage osteoarthrosis medial compartment with a varus deformity of the right knee. Left knee has a well-placed prosthesis without evidence of loosening.  Assessment/Plan Impression: Right knee end-stage DJD  Plan: Pt with end-stage right knee DJD, bone-on-bone, refractory to conservative tx, scheduled for right total knee replacement by Dr. Shelle Iron on 02/19/23. We again  discussed the procedure itself as well as risks, complications and alternatives, including but not limited to DVT, PE, infx, bleeding, failure of procedure, need for secondary procedure including manipulation, nerve injury, ongoing pain/symptoms, anesthesia risk, even stroke or death. Also discussed typical post-op protocols, activity restrictions, need for PT, flexion/extension exercises, time out of work. Discussed need for DVT ppx post-op per protocol. Discussed dental ppx and infx prevention. Also discussed limitations post-operatively such as kneeling and squatting. All questions were answered. Patient desires to proceed with surgery as scheduled.  Will hold supplements, ASA and NSAIDs accordingly. Will remain NPO after midnight the night before surgery. Will present to [facility] for pre-op testing. Will use vanco and gent given her keflex allergy. Anticipate hospital stay to include at least 2 midnights given medical history and to ensure  proper pain control. Plan Xarelto for DVT ppx post-op. Plan Oxycodone, Robaxin, Colace, Miralax. Plan home with HHPT post-op with family members at home for assistance, arrangements made. Will follow up 10-14 days post-op for suture removal and xrays.  Plan right total knee replacement   Dorothy Spark, PA-C for Dr Shelle Iron 02/10/2023, 4:24 PM

## 2023-02-11 ENCOUNTER — Encounter (HOSPITAL_COMMUNITY): Payer: Self-pay

## 2023-02-12 NOTE — Progress Notes (Signed)
Case: [id number] Date/Time: 02/19/23 1010   Procedure: TOTAL KNEE ARTHROPLASTY (Right: Knee)   Anesthesia type: Spinal   Pre-op diagnosis: Right knee degenerative joint disease   Location: [facility] ROOM 10 / [facility]   Surgeons: Jene Every, MD       DISCUSSION: Vickie Ward is a 77 yo female who presents to PAT prior to surgery above. PMH of PAF (not anticoagulated), Factor V Leiden, anemia, DM, GERD, OSA (no CPAP use since weight loss), hx of cervical myelopathy s/p ACDF (02/2013), anxiety, depression.  Anesthesia complications include PONV  Patient follows with Cardiology for hx of PAF. Last seen in clinic on 01/09/23. She was previously on a DOAC but per her Cardiologist's report she had SE from these meds (hot flashes). She had a stress test done on 12/31/22 for pre-op clearance which was low risk (paper copy in chart). Last echo was 10/11/2020 which showed normal EF 60-65%, mild LVH, mild left atrial enlargement, mild MR, mild TR. Cardiac clearance received that patient is low risk on 01/23/23 (paper copy in chart).  Patient follows with PCP for diabetes management. A1c was 7.6 on screening labs. Last saw her PCP in clinic on 9/23. Had some urinary symptoms, UA not consistent with UTI.  Patient follows with Oncology for Factor V Leiden. Last seen on 12/12/22 for DVT prophylaxis recommendations which are as follows: "With factor V Leiden heterozygosity and absence of other inherited thrombophilias, I would recommend that she receive routine VTE prophylaxis after knee replacement surgery."   VS: BP (!) 159/87   Pulse (!) 57   Temp 36.7 C (Oral)   Resp 14   Ht 5\' 3"  (1.6 m)   Wt 77.7 kg   SpO2 100%   BMI 30.36 kg/m   PROVIDERS: Richarda Blade E Cardiology: Daryel November, MD Oncology: Ellin Saba, MD  LABS: Labs reviewed: Acceptable for surgery. (all labs ordered are listed, but only abnormal results are displayed)  Labs Reviewed  SURGICAL PCR SCREEN - Abnormal; Notable for the  following components:      Result Value   Staphylococcus aureus POSITIVE (*)    All other components within normal limits  BASIC METABOLIC PANEL - Abnormal; Notable for the following components:   Glucose, Bld 152 (*)    All other components within normal limits  CBC - Abnormal; Notable for the following components:   Hemoglobin 10.4 (*)    HCT 34.9 (*)    MCV 77.2 (*)    MCH 23.0 (*)    MCHC 29.8 (*)    RDW 19.0 (*)    All other components within normal limits  GLUCOSE, CAPILLARY - Abnormal; Notable for the following components:   Glucose-Capillary 160 (*)    All other components within normal limits     IMAGES:   EKG 07/15/2022 (paper copy in chart):  Sinus bradycardia, rate 56 RBBB   CV:  Lexiscan stress test 02/07/23:  Impressions: The perfusion study is normal. No perfusion defects seen on imaging The patient experienced dizziness No stress induced arrhythmias  Physiologic blood pressure response No infusion induced EKG changes SPECT nuclear imaging demonstrates no evidence of ischemia No evidence of infarct  Past Medical History:  Diagnosis Date   A-fib (HCC)    Anemia    Anxiety    Arthritis    Cervical myelopathy (HCC) 02/04/2018   Depression    Diabetes mellitus without complication (HCC)    Factor V Leiden (HCC)    GERD (gastroesophageal reflux disease)    pt  takes OTC meds   Headache(784.0)    Insomnia    Numbness and tingling    in L arm from neck issues   Osteoporosis    Overactive bladder    takes Vesicare   PONV (postoperative nausea and vomiting)    RBBB    Sleep apnea    sleep study done > 56yrs ago;no cpap and since lost weight much improvement   UTI (lower urinary tract infection)    currently taking Cipro   Visual floaters     Past Surgical History:  Procedure Laterality Date   ABDOMINAL HYSTERECTOMY     ANTERIOR CERVICAL DECOMP/DISCECTOMY FUSION N/A 03/18/2013   Procedure: ACDF C4 - C7 3 LEVELS;  Surgeon: Venita Lick,  MD;  Location: [facility] OR;  Service: Orthopedics;  Laterality: N/A;   COLONOSCOPY     DILATION AND CURETTAGE OF UTERUS     REPLACEMENT TOTAL KNEE     2019   TONSILLECTOMY     adenoidectomy   UPPER GI ENDOSCOPY      MEDICATIONS:  aspirin EC 81 MG tablet   Cholecalciferol (VITAMIN D) 50 MCG (2000 UT) CAPS   cyanocobalamin (,VITAMIN B-12,) 1000 MCG/ML injection   cyanocobalamin 1000 MCG tablet   ELIQUIS 5 MG TABS tablet   estradiol (ESTRACE) 0.1 MG/GM vaginal cream   glipiZIDE (GLUCOTROL XL) 2.5 MG 24 hr tablet   glucose blood (ONETOUCH ULTRA) test strip   hydroxychloroquine (PLAQUENIL) 200 MG tablet   Krill Oil 500 MG CAPS   magnesium oxide (MAG-OX) 400 (240 Mg) MG tablet   Melatonin-Pyridoxine 10-10 MG TBCR   metFORMIN (GLUCOPHAGE-XR) 750 MG 24 hr tablet   nitroGLYCERIN (NITROSTAT) 0.4 MG SL tablet   oxybutynin (DITROPAN-XL) 5 MG 24 hr tablet   ramipril (ALTACE) 2.5 MG capsule   rosuvastatin (CRESTOR) 5 MG tablet   zinc gluconate 50 MG tablet   zolpidem (AMBIEN) 10 MG tablet   No current facility-administered medications for this encounter.   Marcille Blanco [facility] Short Stay/Anesthesiology [facility] Phone [phone] 02/13/2023 9:17 AM

## 2023-02-13 NOTE — Anesthesia Preprocedure Evaluation (Addendum)
Anesthesia Evaluation  Patient identified by MRN, date of birth, ID band Patient awake    Reviewed: Allergy & Precautions, NPO status , Patient's Chart, lab work & pertinent test results  History of Anesthesia Complications (+) PONV and history of anesthetic complications  Airway Mallampati: II  TM Distance: >3 FB Neck ROM: Full    Dental  (+) Dental Advisory Given, Missing   Pulmonary sleep apnea    Pulmonary exam normal breath sounds clear to auscultation       Cardiovascular hypertension, Pt. on medications (-) angina (-) Past MI Normal cardiovascular exam+ dysrhythmias (RBBB) Atrial Fibrillation  Rhythm:Regular Rate:Normal     Neuro/Psych  Headaches PSYCHIATRIC DISORDERS Anxiety Depression    Cervical myelopathy s/p ACDF    GI/Hepatic Neg liver ROS,GERD  Medicated,,  Endo/Other  diabetes, Type 2, Oral Hypoglycemic Agents  Obesity   Renal/GU negative Renal ROS Bladder dysfunction      Musculoskeletal  (+) Arthritis ,  Right knee degenerative joint disease   Abdominal   Peds  Hematology  (+) Blood dyscrasia (Eliquis), anemia Plt 264k Factor V Leiden   Anesthesia Other Findings Day of surgery medications reviewed with the patient.  Reproductive/Obstetrics                             Anesthesia Physical Anesthesia Plan  ASA: 3  Anesthesia Plan: Spinal   Post-op Pain Management: Regional block* and Ofirmev IV (intra-op)*   Induction: Intravenous  PONV Risk Score and Plan: 3 and TIVA, Dexamethasone and Ondansetron  Airway Management Planned: Natural Airway and Simple Face Mask  Additional Equipment:   Intra-op Plan:   Post-operative Plan:   Informed Consent: I have reviewed the patients History and Physical, chart, labs and discussed the procedure including the risks, benefits and alternatives for the proposed anesthesia with the patient or authorized representative who has  indicated his/her understanding and acceptance.     Dental advisory given  Plan Discussed with: CRNA and Anesthesiologist  Anesthesia Plan Comments: (See PAT note from 10/11 by K Gekas PA-C )        Anesthesia Quick Evaluation

## 2023-02-19 ENCOUNTER — Ambulatory Visit (HOSPITAL_COMMUNITY)
Admission: RE | Admit: 2023-02-19 | Discharge: 2023-02-20 | Disposition: A | Discharge status: Home / Self Care | Payer: Medicare Other | Source: Ambulatory Visit | Attending: Specialist | Admitting: Specialist

## 2023-02-19 ENCOUNTER — Ambulatory Visit (HOSPITAL_COMMUNITY): Payer: Self-pay | Admitting: Anesthesiology

## 2023-02-19 ENCOUNTER — Other Ambulatory Visit (HOSPITAL_COMMUNITY): Payer: Self-pay

## 2023-02-19 ENCOUNTER — Ambulatory Visit (HOSPITAL_COMMUNITY): Payer: Medicare Other | Admitting: Physician Assistant

## 2023-02-19 ENCOUNTER — Other Ambulatory Visit: Payer: Self-pay

## 2023-02-19 ENCOUNTER — Ambulatory Visit (HOSPITAL_COMMUNITY): Payer: Medicare Other

## 2023-02-19 ENCOUNTER — Encounter (HOSPITAL_COMMUNITY)
Admission: RE | Disposition: A | Discharge status: Home / Self Care | Payer: Self-pay | Source: Ambulatory Visit | Attending: Specialist

## 2023-02-19 ENCOUNTER — Encounter (HOSPITAL_COMMUNITY): Payer: Self-pay | Admitting: Specialist

## 2023-02-19 DIAGNOSIS — E119 Type 2 diabetes mellitus without complications: Secondary | ICD-10-CM | POA: Insufficient documentation

## 2023-02-19 DIAGNOSIS — Z6830 Body mass index (BMI) 30.0-30.9, adult: Secondary | ICD-10-CM | POA: Diagnosis not present

## 2023-02-19 DIAGNOSIS — I10 Essential (primary) hypertension: Secondary | ICD-10-CM | POA: Insufficient documentation

## 2023-02-19 DIAGNOSIS — F419 Anxiety disorder, unspecified: Secondary | ICD-10-CM | POA: Diagnosis not present

## 2023-02-19 DIAGNOSIS — D649 Anemia, unspecified: Secondary | ICD-10-CM | POA: Insufficient documentation

## 2023-02-19 DIAGNOSIS — Z833 Family history of diabetes mellitus: Secondary | ICD-10-CM | POA: Insufficient documentation

## 2023-02-19 DIAGNOSIS — N3281 Overactive bladder: Secondary | ICD-10-CM | POA: Insufficient documentation

## 2023-02-19 DIAGNOSIS — M21161 Varus deformity, not elsewhere classified, right knee: Secondary | ICD-10-CM | POA: Insufficient documentation

## 2023-02-19 DIAGNOSIS — I4891 Unspecified atrial fibrillation: Secondary | ICD-10-CM | POA: Insufficient documentation

## 2023-02-19 DIAGNOSIS — Z79899 Other long term (current) drug therapy: Secondary | ICD-10-CM | POA: Diagnosis not present

## 2023-02-19 DIAGNOSIS — M1711 Unilateral primary osteoarthritis, right knee: Secondary | ICD-10-CM

## 2023-02-19 DIAGNOSIS — I4510 Unspecified right bundle-branch block: Secondary | ICD-10-CM | POA: Insufficient documentation

## 2023-02-19 DIAGNOSIS — F32A Depression, unspecified: Secondary | ICD-10-CM | POA: Insufficient documentation

## 2023-02-19 DIAGNOSIS — R519 Headache, unspecified: Secondary | ICD-10-CM | POA: Diagnosis not present

## 2023-02-19 DIAGNOSIS — G4730 Sleep apnea, unspecified: Secondary | ICD-10-CM | POA: Insufficient documentation

## 2023-02-19 DIAGNOSIS — E669 Obesity, unspecified: Secondary | ICD-10-CM | POA: Insufficient documentation

## 2023-02-19 DIAGNOSIS — K219 Gastro-esophageal reflux disease without esophagitis: Secondary | ICD-10-CM | POA: Insufficient documentation

## 2023-02-19 DIAGNOSIS — Z7984 Long term (current) use of oral hypoglycemic drugs: Secondary | ICD-10-CM | POA: Diagnosis not present

## 2023-02-19 DIAGNOSIS — D6851 Activated protein C resistance: Secondary | ICD-10-CM | POA: Insufficient documentation

## 2023-02-19 DIAGNOSIS — R2689 Other abnormalities of gait and mobility: Secondary | ICD-10-CM | POA: Insufficient documentation

## 2023-02-19 HISTORY — PX: TOTAL KNEE ARTHROPLASTY: SHX125

## 2023-02-19 LAB — GLUCOSE, CAPILLARY
Glucose-Capillary: 155 mg/dL — ABNORMAL HIGH (ref 70–99)
Glucose-Capillary: 172 mg/dL — ABNORMAL HIGH (ref 70–99)
Glucose-Capillary: 178 mg/dL — ABNORMAL HIGH (ref 70–99)
Glucose-Capillary: 179 mg/dL — ABNORMAL HIGH (ref 70–99)
Glucose-Capillary: 313 mg/dL — ABNORMAL HIGH (ref 70–99)
Glucose-Capillary: 339 mg/dL — ABNORMAL HIGH (ref 70–99)

## 2023-02-19 SURGERY — ARTHROPLASTY, KNEE, TOTAL
Anesthesia: Spinal | Site: Knee | Laterality: Right

## 2023-02-19 MED ORDER — MENTHOL 3 MG MT LOZG: 1.0000 | LOZENGE | OROMUCOSAL | Status: DC | PRN

## 2023-02-19 MED ORDER — MAGNESIUM OXIDE -MG SUPPLEMENT 400 (240 MG) MG PO TABS
400.0000 mg | ORAL_TABLET | Freq: Two times a day (BID) | ORAL | Status: DC
  Administered 2023-02-19 – 2023-02-20 (×2): 400 mg via ORAL
  Filled 2023-02-19 (×2): qty 1

## 2023-02-19 MED ORDER — BISACODYL 5 MG PO TBEC: 5.0000 mg | DELAYED_RELEASE_TABLET | Freq: Every day | ORAL | Status: DC | PRN

## 2023-02-19 MED ORDER — DEXAMETHASONE SODIUM PHOSPHATE 10 MG/ML IJ SOLN
INTRAMUSCULAR | Status: AC
  Filled 2023-02-19: qty 1

## 2023-02-19 MED ORDER — PHENYLEPHRINE HCL-NACL 20-0.9 MG/250ML-% IV SOLN
INTRAVENOUS | Status: DC | PRN
  Administered 2023-02-19: 20 ug/min via INTRAVENOUS

## 2023-02-19 MED ORDER — RAMIPRIL 1.25 MG PO CAPS
2.5000 mg | ORAL_CAPSULE | Freq: Every day | ORAL | Status: DC
  Administered 2023-02-19 – 2023-02-20 (×2): 2.5 mg via ORAL
  Filled 2023-02-19 (×2): qty 2

## 2023-02-19 MED ORDER — ACETAMINOPHEN 500 MG PO TABS
1000.0000 mg | ORAL_TABLET | Freq: Four times a day (QID) | ORAL | Status: DC
  Administered 2023-02-19 – 2023-02-20 (×3): 1000 mg via ORAL
  Filled 2023-02-19 (×3): qty 2

## 2023-02-19 MED ORDER — ASPIRIN 81 MG PO TBEC
81.0000 mg | DELAYED_RELEASE_TABLET | Freq: Two times a day (BID) | ORAL | 1 refills | Status: DC
  Filled 2023-02-19: qty 60, 30d supply, fill #0

## 2023-02-19 MED ORDER — INSULIN ASPART 100 UNIT/ML IJ SOLN
0.0000 [IU] | Freq: Three times a day (TID) | INTRAMUSCULAR | Status: DC
  Administered 2023-02-19: 11 [IU] via SUBCUTANEOUS
  Administered 2023-02-20 (×2): 3 [IU] via SUBCUTANEOUS

## 2023-02-19 MED ORDER — RISAQUAD PO CAPS
1.0000 | ORAL_CAPSULE | Freq: Every day | ORAL | Status: DC
  Administered 2023-02-19 – 2023-02-20 (×2): 1 via ORAL
  Filled 2023-02-19 (×2): qty 1

## 2023-02-19 MED ORDER — POLYETHYLENE GLYCOL 3350 17 G PO PACK: 17.0000 g | PACK | Freq: Every day | ORAL | Status: DC | PRN

## 2023-02-19 MED ORDER — DEXAMETHASONE SODIUM PHOSPHATE 10 MG/ML IJ SOLN
INTRAMUSCULAR | Status: DC | PRN
  Administered 2023-02-19: 10 mg

## 2023-02-19 MED ORDER — PRONTOSAN WOUND IRRIGATION OPTIME
TOPICAL | Status: DC | PRN
  Administered 2023-02-19: 1 via TOPICAL

## 2023-02-19 MED ORDER — ONDANSETRON HCL 4 MG/2ML IJ SOLN: 4.0000 mg | Freq: Four times a day (QID) | INTRAMUSCULAR | Status: DC | PRN

## 2023-02-19 MED ORDER — MELATONIN-PYRIDOXINE ER 10-10 MG PO TBCR: 1.0000 | EXTENDED_RELEASE_TABLET | Freq: Every day | ORAL | Status: DC | PRN

## 2023-02-19 MED ORDER — BUPIVACAINE-EPINEPHRINE 0.25% -1:200000 IJ SOLN
INTRAMUSCULAR | Status: DC | PRN
  Administered 2023-02-19: 50 mL

## 2023-02-19 MED ORDER — METOCLOPRAMIDE HCL 5 MG/ML IJ SOLN: 5.0000 mg | Freq: Three times a day (TID) | INTRAMUSCULAR | Status: DC | PRN

## 2023-02-19 MED ORDER — FENTANYL CITRATE PF 50 MCG/ML IJ SOSY: 25.0000 ug | PREFILLED_SYRINGE | INTRAMUSCULAR | Status: DC | PRN

## 2023-02-19 MED ORDER — DOCUSATE SODIUM 100 MG PO CAPS
100.0000 mg | ORAL_CAPSULE | Freq: Two times a day (BID) | ORAL | Status: DC
  Administered 2023-02-19 – 2023-02-20 (×2): 100 mg via ORAL
  Filled 2023-02-19 (×2): qty 1

## 2023-02-19 MED ORDER — MAGNESIUM CITRATE PO SOLN: 1.0000 | Freq: Once | ORAL | Status: DC | PRN

## 2023-02-19 MED ORDER — METOCLOPRAMIDE HCL 5 MG PO TABS: 5.0000 mg | ORAL_TABLET | Freq: Three times a day (TID) | ORAL | Status: DC | PRN

## 2023-02-19 MED ORDER — ONDANSETRON HCL 4 MG/2ML IJ SOLN
INTRAMUSCULAR | Status: AC
  Filled 2023-02-19: qty 2

## 2023-02-19 MED ORDER — POTASSIUM CHLORIDE IN NACL 20-0.9 MEQ/L-% IV SOLN
INTRAVENOUS | Status: DC
  Filled 2023-02-19: qty 1000

## 2023-02-19 MED ORDER — DOCUSATE SODIUM 100 MG PO CAPS
100.0000 mg | ORAL_CAPSULE | Freq: Two times a day (BID) | ORAL | 2 refills | Status: DC
  Filled 2023-02-19: qty 60, 30d supply, fill #0

## 2023-02-19 MED ORDER — LACTATED RINGERS IV SOLN: INTRAVENOUS | Status: DC

## 2023-02-19 MED ORDER — ALUM & MAG HYDROXIDE-SIMETH 200-200-20 MG/5ML PO SUSP: 30.0000 mL | ORAL | Status: DC | PRN

## 2023-02-19 MED ORDER — ONDANSETRON HCL 4 MG/2ML IJ SOLN: 4.0000 mg | Freq: Once | INTRAMUSCULAR | Status: DC | PRN

## 2023-02-19 MED ORDER — VANCOMYCIN HCL IN DEXTROSE 1-5 GM/200ML-% IV SOLN
1000.0000 mg | Freq: Once | INTRAVENOUS | Status: AC
  Administered 2023-02-19: 1000 mg via INTRAVENOUS
  Filled 2023-02-19: qty 200

## 2023-02-19 MED ORDER — OXYCODONE HCL 5 MG PO TABS: 10.0000 mg | ORAL_TABLET | ORAL | Status: DC | PRN

## 2023-02-19 MED ORDER — STERILE WATER FOR IRRIGATION IR SOLN
Status: DC | PRN
  Administered 2023-02-19: 1000 mL

## 2023-02-19 MED ORDER — ASPIRIN 81 MG PO CHEW
81.0000 mg | CHEWABLE_TABLET | Freq: Two times a day (BID) | ORAL | Status: DC
  Administered 2023-02-20: 81 mg via ORAL
  Filled 2023-02-19: qty 1

## 2023-02-19 MED ORDER — MIDAZOLAM HCL 2 MG/2ML IJ SOLN: 1.0000 mg | Freq: Once | INTRAMUSCULAR | Status: DC

## 2023-02-19 MED ORDER — ZOLPIDEM TARTRATE 5 MG PO TABS
5.0000 mg | ORAL_TABLET | Freq: Every day | ORAL | Status: DC
  Administered 2023-02-19: 5 mg via ORAL
  Filled 2023-02-19: qty 1

## 2023-02-19 MED ORDER — DIPHENHYDRAMINE HCL 12.5 MG/5ML PO ELIX
12.5000 mg | ORAL_SOLUTION | ORAL | Status: DC | PRN
  Filled 2023-02-19: qty 10

## 2023-02-19 MED ORDER — SODIUM CHLORIDE 0.9 % IR SOLN
Status: DC | PRN
  Administered 2023-02-19: 250 mL
  Administered 2023-02-19: 1000 mL

## 2023-02-19 MED ORDER — ORAL CARE MOUTH RINSE: 15.0000 mL | Freq: Once | OROMUCOSAL | Status: AC

## 2023-02-19 MED ORDER — DEXAMETHASONE SODIUM PHOSPHATE 10 MG/ML IJ SOLN
INTRAMUSCULAR | Status: DC | PRN
Start: 2023-02-19 — End: 2023-02-19
  Administered 2023-02-19: 5 mg via INTRAVENOUS

## 2023-02-19 MED ORDER — NITROGLYCERIN 0.4 MG SL SUBL: 0.4000 mg | SUBLINGUAL_TABLET | SUBLINGUAL | Status: DC | PRN

## 2023-02-19 MED ORDER — CHLORHEXIDINE GLUCONATE 0.12 % MT SOLN
15.0000 mL | Freq: Once | OROMUCOSAL | Status: AC
  Administered 2023-02-19: 15 mL via OROMUCOSAL

## 2023-02-19 MED ORDER — SODIUM CHLORIDE 0.9 % IV SOLN
INTRAVENOUS | Status: DC | PRN
  Administered 2023-02-19: 60 mL

## 2023-02-19 MED ORDER — OXYCODONE HCL 5 MG PO TABS
5.0000 mg | ORAL_TABLET | ORAL | Status: DC | PRN
  Administered 2023-02-19: 5 mg via ORAL
  Filled 2023-02-19: qty 1

## 2023-02-19 MED ORDER — FENTANYL CITRATE PF 50 MCG/ML IJ SOSY
50.0000 ug | PREFILLED_SYRINGE | Freq: Once | INTRAMUSCULAR | Status: AC
  Administered 2023-02-19: 75 ug via INTRAVENOUS
  Filled 2023-02-19: qty 2

## 2023-02-19 MED ORDER — METHOCARBAMOL 500 MG PO TABS
500.0000 mg | ORAL_TABLET | Freq: Three times a day (TID) | ORAL | 1 refills | Status: DC | PRN
  Filled 2023-02-19: qty 30, 10d supply, fill #0

## 2023-02-19 MED ORDER — 0.9 % SODIUM CHLORIDE (POUR BTL) OPTIME
TOPICAL | Status: DC | PRN
  Administered 2023-02-19: 1000 mL

## 2023-02-19 MED ORDER — TRANEXAMIC ACID-NACL 1000-0.7 MG/100ML-% IV SOLN
1000.0000 mg | INTRAVENOUS | Status: AC
  Administered 2023-02-19: 1000 mg via INTRAVENOUS
  Filled 2023-02-19: qty 100

## 2023-02-19 MED ORDER — ROPIVACAINE HCL 5 MG/ML IJ SOLN
INTRAMUSCULAR | Status: DC | PRN
  Administered 2023-02-19: 20 mL via PERINEURAL

## 2023-02-19 MED ORDER — BUPIVACAINE IN DEXTROSE 0.75-8.25 % IT SOLN
INTRATHECAL | Status: DC | PRN
  Administered 2023-02-19: 2 mL via INTRATHECAL

## 2023-02-19 MED ORDER — OXYCODONE HCL 5 MG PO TABS
5.0000 mg | ORAL_TABLET | ORAL | 0 refills | Status: DC | PRN
  Filled 2023-02-19: qty 40, 7d supply, fill #0

## 2023-02-19 MED ORDER — ONDANSETRON HCL 4 MG/2ML IJ SOLN
INTRAMUSCULAR | Status: DC | PRN
  Administered 2023-02-19: 4 mg via INTRAVENOUS

## 2023-02-19 MED ORDER — HYDROMORPHONE HCL 1 MG/ML IJ SOLN
0.5000 mg | INTRAMUSCULAR | Status: DC | PRN
  Administered 2023-02-20: 1 mg via INTRAVENOUS
  Filled 2023-02-19: qty 1

## 2023-02-19 MED ORDER — EPHEDRINE SULFATE-NACL 50-0.9 MG/10ML-% IV SOSY
PREFILLED_SYRINGE | INTRAVENOUS | Status: DC | PRN
Start: 2023-02-19 — End: 2023-02-19
  Administered 2023-02-19: 7.5 mg via INTRAVENOUS

## 2023-02-19 MED ORDER — PROPOFOL 500 MG/50ML IV EMUL
INTRAVENOUS | Status: AC
  Filled 2023-02-19: qty 50

## 2023-02-19 MED ORDER — OXYBUTYNIN CHLORIDE ER 5 MG PO TB24
5.0000 mg | ORAL_TABLET | Freq: Every morning | ORAL | Status: DC
  Administered 2023-02-20: 5 mg via ORAL
  Filled 2023-02-19: qty 1

## 2023-02-19 MED ORDER — METHOCARBAMOL 500 MG PO TABS
500.0000 mg | ORAL_TABLET | Freq: Four times a day (QID) | ORAL | Status: DC | PRN
  Administered 2023-02-19: 500 mg via ORAL
  Filled 2023-02-19: qty 1

## 2023-02-19 MED ORDER — INSULIN ASPART 100 UNIT/ML IJ SOLN: 0.0000 [IU] | INTRAMUSCULAR | Status: DC | PRN

## 2023-02-19 MED ORDER — PROPOFOL 500 MG/50ML IV EMUL
INTRAVENOUS | Status: DC | PRN
  Administered 2023-02-19: 30 mg via INTRAVENOUS
  Administered 2023-02-19: 80 ug/kg/min via INTRAVENOUS
  Administered 2023-02-19 (×2): 20 mg via INTRAVENOUS

## 2023-02-19 MED ORDER — ZINC GLUCONATE 50 MG PO TABS: 50.0000 mg | ORAL_TABLET | Freq: Every day | ORAL | Status: DC

## 2023-02-19 MED ORDER — METHOCARBAMOL 1000 MG/10ML IJ SOLN: 500.0000 mg | Freq: Four times a day (QID) | INTRAMUSCULAR | Status: DC | PRN

## 2023-02-19 MED ORDER — VANCOMYCIN HCL IN DEXTROSE 1-5 GM/200ML-% IV SOLN
1000.0000 mg | INTRAVENOUS | Status: AC
  Administered 2023-02-19: 1000 mg via INTRAVENOUS
  Filled 2023-02-19: qty 200

## 2023-02-19 MED ORDER — VITAMIN D 25 MCG (1000 UNIT) PO TABS
2000.0000 [IU] | ORAL_TABLET | Freq: Every day | ORAL | Status: DC
  Administered 2023-02-20: 2000 [IU] via ORAL
  Filled 2023-02-19: qty 2

## 2023-02-19 MED ORDER — VITAMIN B-12 1000 MCG PO TABS
1000.0000 ug | ORAL_TABLET | Freq: Every day | ORAL | Status: DC
  Administered 2023-02-20: 1000 ug via ORAL
  Filled 2023-02-19: qty 1

## 2023-02-19 MED ORDER — ACETAMINOPHEN 325 MG PO TABS
325.0000 mg | ORAL_TABLET | Freq: Four times a day (QID) | ORAL | Status: DC | PRN
  Administered 2023-02-20: 650 mg via ORAL
  Filled 2023-02-19: qty 2

## 2023-02-19 MED ORDER — POLYETHYLENE GLYCOL 3350 17 GM/SCOOP PO POWD
17.0000 g | Freq: Every day | ORAL | 0 refills | Status: DC
  Filled 2023-02-19: qty 238, 14d supply, fill #0

## 2023-02-19 MED ORDER — LEVOFLOXACIN IN D5W 500 MG/100ML IV SOLN
500.0000 mg | INTRAVENOUS | Status: AC
  Administered 2023-02-19: 500 mg via INTRAVENOUS
  Filled 2023-02-19: qty 100

## 2023-02-19 MED ORDER — ROSUVASTATIN CALCIUM 5 MG PO TABS
5.0000 mg | ORAL_TABLET | Freq: Every day | ORAL | Status: DC
  Administered 2023-02-20: 5 mg via ORAL
  Filled 2023-02-19: qty 1

## 2023-02-19 MED ORDER — ONDANSETRON HCL 4 MG PO TABS
4.0000 mg | ORAL_TABLET | Freq: Four times a day (QID) | ORAL | Status: DC | PRN
  Administered 2023-02-20: 4 mg via ORAL

## 2023-02-19 MED ORDER — PHENOL 1.4 % MT LIQD: 1.0000 | OROMUCOSAL | Status: DC | PRN

## 2023-02-19 MED ORDER — ACETAMINOPHEN 10 MG/ML IV SOLN
1000.0000 mg | INTRAVENOUS | Status: AC
  Administered 2023-02-19: 1000 mg via INTRAVENOUS
  Filled 2023-02-19: qty 100

## 2023-02-19 SURGICAL SUPPLY — 76 items
AGENT HMST SPONGE THK3/8 (HEMOSTASIS)
ATTUNE PS FEM RT SZ 5 CEM KNEE (Femur) IMPLANT
ATTUNE PSRP INSR SZ5 5 KNEE (Insert) IMPLANT
BAG COUNTER SPONGE SURGICOUNT (BAG) IMPLANT
BAG DECANTER FOR FLEXI CONT (MISCELLANEOUS) ×1 IMPLANT
BAG SPEC THK2 15X12 ZIP CLS (MISCELLANEOUS)
BAG SPNG CNTER NS LX DISP (BAG)
BAG ZIPLOCK 12X15 (MISCELLANEOUS) IMPLANT
BASE TIBIAL ROT PLAT SZ 5 KNEE (Knees) IMPLANT
BLADE SAW SGTL 11.0X1.19X90.0M (BLADE) ×1 IMPLANT
BLADE SAW SGTL 13.0X1.19X90.0M (BLADE) ×1 IMPLANT
BLADE SURG SZ10 CARB STEEL (BLADE) ×2 IMPLANT
BNDG CMPR 5X4 KNIT ELC UNQ LF (GAUZE/BANDAGES/DRESSINGS) ×1
BNDG CMPR 6 X 5 YARDS HK CLSR (GAUZE/BANDAGES/DRESSINGS) ×1
BNDG ELASTIC 4INX 5YD STR LF (GAUZE/BANDAGES/DRESSINGS) ×1 IMPLANT
BNDG ELASTIC 6INX 5YD STR LF (GAUZE/BANDAGES/DRESSINGS) ×1 IMPLANT
BOWL SMART MIX CTS (DISPOSABLE) ×1 IMPLANT
BSPLAT TIB 5 CMNT ROT PLAT STR (Knees) ×1 IMPLANT
CEMENT HV SMART SET (Cement) ×2 IMPLANT
COVER SURGICAL LIGHT HANDLE (MISCELLANEOUS) ×1 IMPLANT
CUFF TOURN SGL QUICK 34 (TOURNIQUET CUFF) ×1
CUFF TRNQT CYL 34X4.125X (TOURNIQUET CUFF) ×1 IMPLANT
DRAPE INCISE IOBAN 66X45 STRL (DRAPES) IMPLANT
DRAPE ORTHO SPLIT 77X108 STRL (DRAPES) ×2
DRAPE SHEET LG 3/4 BI-LAMINATE (DRAPES) ×1 IMPLANT
DRAPE SURG ORHT 6 SPLT 77X108 (DRAPES) ×2 IMPLANT
DRAPE TOP 10253 STERILE (DRAPES) ×1 IMPLANT
DRAPE U-SHAPE 47X51 STRL (DRAPES) ×1 IMPLANT
DRSG AQUACEL AG ADV 3.5X10 (GAUZE/BANDAGES/DRESSINGS) ×1 IMPLANT
DRSG TEGADERM 4X4.75 (GAUZE/BANDAGES/DRESSINGS) IMPLANT
DURAPREP 26ML APPLICATOR (WOUND CARE) ×1 IMPLANT
ELECT BLADE TIP CTD 4 INCH (ELECTRODE) ×1 IMPLANT
ELECT REM PT RETURN 15FT ADLT (MISCELLANEOUS) ×1 IMPLANT
EVACUATOR 1/8 PVC DRAIN (DRAIN) IMPLANT
GAUZE SPONGE 2X2 8PLY STRL LF (GAUZE/BANDAGES/DRESSINGS) IMPLANT
GLOVE BIO SURGEON STRL SZ7 (GLOVE) ×1 IMPLANT
GLOVE BIOGEL PI IND STRL 7.0 (GLOVE) ×1 IMPLANT
GLOVE BIOGEL PI IND STRL 8 (GLOVE) ×1 IMPLANT
GLOVE SURG SS PI 8.0 STRL IVOR (GLOVE) ×1 IMPLANT
GOWN STRL REUS W/ TWL XL LVL3 (GOWN DISPOSABLE) ×2 IMPLANT
GOWN STRL REUS W/TWL XL LVL3 (GOWN DISPOSABLE) ×2
HANDPIECE INTERPULSE COAX TIP (DISPOSABLE) ×1
HEMOSTAT SPONGE AVITENE ULTRA (HEMOSTASIS) IMPLANT
HOLDER FOLEY CATH W/STRAP (MISCELLANEOUS) IMPLANT
IMMOBILIZER KNEE 20 (SOFTGOODS) ×1
IMMOBILIZER KNEE 20 THIGH 36 (SOFTGOODS) ×1 IMPLANT
KIT TURNOVER KIT A (KITS) IMPLANT
MANIFOLD NEPTUNE II (INSTRUMENTS) ×1 IMPLANT
NS IRRIG 1000ML POUR BTL (IV SOLUTION) IMPLANT
PACK TOTAL KNEE CUSTOM (KITS) ×1 IMPLANT
PATELLA MEDIAL ATTUN 35MM KNEE (Knees) IMPLANT
PIN STEINMAN FIXATION KNEE (PIN) IMPLANT
PROTECTOR NERVE ULNAR (MISCELLANEOUS) ×1 IMPLANT
SAW OSC TIP CART 19.5X105X1.3 (SAW) IMPLANT
SEALER BIPOLAR AQUA 6.0 (INSTRUMENTS) IMPLANT
SET HNDPC FAN SPRY TIP SCT (DISPOSABLE) ×1 IMPLANT
SOLUTION PRONTOSAN WOUND 350ML (IRRIGATION / IRRIGATOR) ×1 IMPLANT
SPIKE FLUID TRANSFER (MISCELLANEOUS) ×1 IMPLANT
STAPLER VISISTAT (STAPLE) IMPLANT
STRIP CLOSURE SKIN 1/2X4 (GAUZE/BANDAGES/DRESSINGS) IMPLANT
SUT BONE WAX W31G (SUTURE) ×1 IMPLANT
SUT MNCRL AB 4-0 PS2 18 (SUTURE) IMPLANT
SUT STRATAFIX 0 PDS 27 VIOLET (SUTURE) ×1
SUT VIC AB 1 CT1 27 (SUTURE) ×3
SUT VIC AB 1 CT1 27XBRD ANTBC (SUTURE) ×3 IMPLANT
SUT VIC AB 2-0 CT1 27 (SUTURE) ×3
SUT VIC AB 2-0 CT1 TAPERPNT 27 (SUTURE) ×3 IMPLANT
SUTURE STRATFX 0 PDS 27 VIOLET (SUTURE) ×1 IMPLANT
SYR 3ML LL SCALE MARK (SYRINGE) IMPLANT
TIBIAL BASE ROT PLAT SZ 5 KNEE (Knees) ×1 IMPLANT
TRAY FOLEY MTR SLVR 14FR STAT (SET/KITS/TRAYS/PACK) IMPLANT
TRAY FOLEY MTR SLVR 16FR STAT (SET/KITS/TRAYS/PACK) ×1 IMPLANT
TUBE SUCTION HIGH CAP CLEAR NV (SUCTIONS) ×1 IMPLANT
WATER STERILE IRR 1000ML POUR (IV SOLUTION) ×1 IMPLANT
WIPE CHG 2% PREP (PERSONAL CARE ITEMS) ×1 IMPLANT
WRAP KNEE MAXI GEL POST OP (GAUZE/BANDAGES/DRESSINGS) ×1 IMPLANT

## 2023-02-19 NOTE — Plan of Care (Signed)
[facility] Tonsillectomy/Adenoidectomy, Postoperative PEDS care plan entered in error.

## 2023-02-19 NOTE — Anesthesia Procedure Notes (Signed)
Anesthesia Regional Block: Adductor canal block   Pre-Anesthetic Checklist: , timeout performed,  Correct Patient, Correct Site, Correct Laterality,  Correct Procedure, Correct Position, site marked,  Risks and benefits discussed,  Surgical consent,  Pre-op evaluation,  At surgeon's request and post-op pain management  Laterality: Right  Prep: chloraprep       Needles:  Injection technique: Single-shot  Needle Type: Echogenic Needle     Needle Length: 9cm  Needle Gauge: 21     Additional Needles:   Procedures:,,,, ultrasound used (permanent image in chart),,    Narrative:  Start time: 02/19/2023 9:35 AM End time: 02/19/2023 9:42 AM Injection made incrementally with aspirations every 5 mL.  Performed by: Personally  Anesthesiologist: Collene Schlichter, MD  Additional Notes: No pain on injection. No increased resistance to injection. Injection made in 5cc increments.  Good needle visualization.  Patient tolerated procedure well.

## 2023-02-19 NOTE — Brief Op Note (Addendum)
02/19/2023  10:08 AM  PATIENT:  Vickie Ward  77 y.o. female  PRE-OPERATIVE DIAGNOSIS:  Right knee degenerative joint disease The aquamantis was utilized for this case to help facilitate better hemostasis as patient was felt to be at increased risk of bleeding because of recent anticoagulation use.   POST-OPERATIVE DIAGNOSIS:  * No post-op diagnosis entered *  PROCEDURE:  Procedure(s): TOTAL KNEE ARTHROPLASTY (Right)  SURGEON:  Surgeons and Role:    Jene Every, MD - Primary  PHYSICIAN ASSISTANT:   ASSISTANTS: Bissell   ANESTHESIA:   spinal TT:  EBL:  50   BLOOD ADMINISTERED:none  DRAINS: none   LOCAL MEDICATIONS USED:  MARCAINE     SPECIMEN:  No Specimen  DISPOSITION OF SPECIMEN:  N/A  COUNTS:  YES  DICTATION: .Other Dictation: Dictation Number [id number]  PLAN OF CARE: Admit for overnight observation  PATIENT DISPOSITION:  PACU - hemodynamically stable.   Delay start of Pharmacological VTE agent (>24hrs) due to surgical blood loss or risk of bleeding: no

## 2023-02-19 NOTE — Plan of Care (Signed)
  Problem: Education: Goal: Knowledge of General Education information will improve Description: Including pain rating scale, medication(s)/side effects and non-pharmacologic comfort measures Outcome: Progressing   Problem: Activity: Goal: Risk for activity intolerance will decrease Outcome: Progressing   Problem: Nutrition: Goal: Adequate nutrition will be maintained Outcome: Progressing   Problem: Elimination: Goal: Will not experience complications related to bowel motility Outcome: Progressing   Problem: Pain Management: Goal: General experience of comfort will improve Outcome: Progressing   Problem: Safety: Goal: Ability to remain free from injury will improve Outcome: Progressing

## 2023-02-19 NOTE — Anesthesia Procedure Notes (Signed)
Spinal  Patient location during procedure: OR Start time: 02/19/2023 11:01 AM End time: 02/19/2023 11:04 AM Reason for block: surgical anesthesia Staffing Performed: anesthesiologist  Anesthesiologist: Collene Schlichter, MD Performed by: Collene Schlichter, MD Authorized by: Collene Schlichter, MD   Preanesthetic Checklist Completed: patient identified, IV checked, risks and benefits discussed, surgical consent, monitors and equipment checked, pre-op evaluation and timeout performed Spinal Block Patient position: sitting Prep: DuraPrep and site prepped and draped Patient monitoring: continuous pulse ox and blood pressure Approach: midline Location: L3-4 Injection technique: single-shot Needle Needle type: Pencan  Needle gauge: 24 G Assessment Events: CSF return Additional Notes Functioning IV was confirmed and monitors were applied. Sterile prep and drape, including hand hygiene, mask and sterile gloves were used. The patient was positioned and the spine was prepped. The skin was anesthetized with lidocaine.  Free flow of clear CSF was obtained prior to injecting local anesthetic into the CSF.  The spinal needle aspirated freely following injection.  The needle was carefully withdrawn.  The patient tolerated the procedure well. Consent was obtained prior to procedure with all questions answered and concerns addressed. Risks including but not limited to bleeding, infection, nerve damage, paralysis, failed block, inadequate analgesia, allergic reaction, high spinal, itching and headache were discussed and the patient wished to proceed.   Vickie Aran, MD

## 2023-02-19 NOTE — Plan of Care (Signed)
  Problem: Education: Goal: Knowledge of General Education information will improve Description: Including pain rating scale, medication(s)/side effects and non-pharmacologic comfort measures Outcome: Progressing   Problem: Activity: Goal: Risk for activity intolerance will decrease Outcome: Progressing   Problem: Nutrition: Goal: Adequate nutrition will be maintained Outcome: Progressing   Problem: Elimination: Goal: Will not experience complications related to bowel motility Outcome: Progressing   Problem: Pain Management: Goal: General experience of comfort will improve Outcome: Progressing   Problem: Safety: Goal: Ability to remain free from injury will improve Outcome: Progressing   Problem: Education: Goal: Knowledge of the prescribed therapeutic regimen will improve Outcome: Progressing   Problem: Activity: Goal: Ability to avoid complications of mobility impairment will improve Outcome: Progressing   Problem: Pain Management: Goal: Pain level will decrease with appropriate interventions Outcome: Progressing

## 2023-02-19 NOTE — Discharge Instructions (Addendum)
Elevate leg above heart 6x a day for each Use knee immobilizer while walking until can SLR x 10 Use knee immobilizer in bed to keep knee in extension Aquacel dressing may remain in place for one week post-op. May shower with aquacel dressing in place. If the dressing becomes saturated or peels off, you may remove aquacel dressing. After one week, remove dressing. Do not remove steri-strips if they are present. Place new dressing with gauze and tape or ACE bandage which should be kept clean and dry and changed daily.  INSTRUCTIONS AFTER JOINT REPLACEMENT   Remove items at home which could result in a fall. This includes throw rugs or furniture in walking pathways ICE to the affected joint every three hours while awake for 30 minutes at a time, for at least the first 3-5 days, and then as needed for pain and swelling.  Continue to use ice for pain and swelling. You may notice swelling that will progress down to the foot and ankle.  This is normal after surgery.  Elevate your leg when you are not up walking on it.   Continue to use the breathing machine you got in the hospital (incentive spirometer) which will help keep your temperature down.  It is common for your temperature to cycle up and down following surgery, especially at night when you are not up moving around and exerting yourself.  The breathing machine keeps your lungs expanded and your temperature down.   DIET:  As you were doing prior to hospitalization, we recommend a well-balanced diet.  DRESSING / WOUND CARE / SHOWERING  Keep the surgical dressing for one week post-op.  The dressing is water proof, so you can shower without any extra covering.  IF THE DRESSING FALLS OFF or the wound gets wet inside, change the dressing with sterile gauze.  Please use good hand washing techniques before changing the dressing.  Do not use any lotions or creams on the incision until instructed by your surgeon.    ACTIVITY  Increase activity  slowly as tolerated, but follow the weight bearing instructions below.   No driving for 6 weeks or until further direction given by your physician.  You cannot drive while taking narcotics.  No lifting or carrying greater than 10 lbs. until further directed by your surgeon. Avoid periods of inactivity such as sitting longer than an hour when not asleep. This helps prevent blood clots.  You may return to work once you are authorized by your doctor.     WEIGHT BEARING   Weight bearing as tolerated with assist device (walker, cane, etc) as directed, use it as long as suggested by your surgeon or therapist, typically at least 4-6 weeks.   EXERCISES  Results after joint replacement surgery are often greatly improved when you follow the exercise, range of motion and muscle strengthening exercises prescribed by your doctor. Safety measures are also important to protect the joint from further injury. Any time any of these exercises cause you to have increased pain or swelling, decrease what you are doing until you are comfortable again and then slowly increase them. If you have problems or questions, call your caregiver or physical therapist for advice.   Rehabilitation is important following a joint replacement. After just a few days of immobilization, the muscles of the leg can become weakened and shrink (atrophy).  These exercises are designed to build up the tone and strength of the thigh and leg muscles and to improve motion. Often times  heat used for twenty to thirty minutes before working out will loosen up your tissues and help with improving the range of motion but do not use heat for the first two weeks following surgery (sometimes heat can increase post-operative swelling).   These exercises can be done on a training (exercise) mat, on the floor, on a table or on a bed. Use whatever works the best and is most comfortable for you.    Use music or television while you are exercising so that the  exercises are a pleasant break in your day. This will make your life better with the exercises acting as a break in your routine that you can look forward to.   Perform all exercises about fifteen times, three times per day or as directed.  You should exercise both the operative leg and the other leg as well.  Exercises include:   Quad Sets - Tighten up the muscle on the front of the thigh (Quad) and hold for 5-10 seconds.   Straight Leg Raises - With your knee straight (if you were given a brace, keep it on), lift the leg to 60 degrees, hold for 3 seconds, and slowly lower the leg.  Perform this exercise against resistance later as your leg gets stronger.  Leg Slides: Lying on your back, slowly slide your foot toward your buttocks, bending your knee up off the floor (only go as far as is comfortable). Then slowly slide your foot back down until your leg is flat on the floor again.  Angel Wings: Lying on your back spread your legs to the side as far apart as you can without causing discomfort.  Hamstring Strength:  Lying on your back, push your heel against the floor with your leg straight by tightening up the muscles of your buttocks.  Repeat, but this time bend your knee to a comfortable angle, and push your heel against the floor.  You may put a pillow under the heel to make it more comfortable if necessary.   A rehabilitation program following joint replacement surgery can speed recovery and prevent re-injury in the future due to weakened muscles. Contact your doctor or a physical therapist for more information on knee rehabilitation.    CONSTIPATION  Constipation is defined medically as fewer than three stools per week and severe constipation as less than one stool per week.  Even if you have a regular bowel pattern at home, your normal regimen is likely to be disrupted due to multiple reasons following surgery.  Combination of anesthesia, postoperative narcotics, change in appetite and fluid  intake all can affect your bowels.   YOU MUST use at least one of the following options; they are listed in order of increasing strength to get the job done.  They are all available over the counter, and you may need to use some, POSSIBLY even all of these options:    Drink plenty of fluids (prune juice may be helpful) and high fiber foods Colace 100 mg by mouth twice a day  Senokot for constipation as directed and as needed Dulcolax (bisacodyl), take with full glass of water  Miralax (polyethylene glycol) once or twice a day as needed.  If you have tried all these things and are unable to have a bowel movement in the first 3-4 days after surgery call either your surgeon or your primary doctor.    If you experience loose stools or diarrhea, hold the medications until you stool forms back up.  If your  symptoms do not get better within 1 week or if they get worse, check with your doctor.  If you experience "the worst abdominal pain ever" or develop nausea or vomiting, please contact the office immediately for further recommendations for treatment.   ITCHING:  If you experience itching with your medications, try taking only a single pain pill, or even half a pain pill at a time.  You can also use Benadryl over the counter for itching or also to help with sleep.   TED HOSE STOCKINGS:  Use stockings on both legs until for at least 2 weeks or as directed by physician office. They may be removed at night for sleeping.  MEDICATIONS:  See your medication summary on the "After Visit Summary" that nursing will review with you.  You may have some home medications which will be placed on hold until you complete the course of blood thinner medication.  It is important for you to complete the blood thinner medication as prescribed.  PRECAUTIONS:  If you experience chest pain or shortness of breath - call 911 immediately for transfer to the hospital emergency department.   If you develop a fever greater that  101 F, purulent drainage from wound, increased redness or drainage from wound, foul odor from the wound/dressing, or calf pain - CONTACT YOUR SURGEON.                                                   FOLLOW-UP APPOINTMENTS:  If you do not already have a post-op appointment, please call the office for an appointment to be seen by your surgeon.  Guidelines for how soon to be seen are listed in your "After Visit Summary", but are typically between 1-4 weeks after surgery.  OTHER INSTRUCTIONS:   Knee Replacement:  Do not place pillow under knee, focus on keeping the knee straight while resting. CPM instructions: 0-90 degrees, 2 hours in the morning, 2 hours in the afternoon, and 2 hours in the evening. Place foam block, curve side up under heel at all times except when in CPM or when walking.  DO NOT modify, tear, cut, or change the foam block in any way.  POST-OPERATIVE OPIOID TAPER INSTRUCTIONS: It is important to wean off of your opioid medication as soon as possible. If you do not need pain medication after your surgery it is ok to stop day one. Opioids include: Codeine, Hydrocodone(Norco, Vicodin), Oxycodone(Percocet, oxycontin) and hydromorphone amongst others.  Long term and even short term use of opiods can cause: Increased pain response Dependence Constipation Depression Respiratory depression And more.  Withdrawal symptoms can include Flu like symptoms Nausea, vomiting And more Techniques to manage these symptoms Hydrate well Eat regular healthy meals Stay active Use relaxation techniques(deep breathing, meditating, yoga) Do Not substitute Alcohol to help with tapering If you have been on opioids for less than two weeks and do not have pain than it is ok to stop all together.  Plan to wean off of opioids This plan should start within one week post op of your joint replacement. Maintain the same interval or time between taking each dose and first decrease the dose.  Cut the  total daily intake of opioids by one tablet each day Next start to increase the time between doses. The last dose that should be eliminated is the evening dose.  MAKE SURE YOU:  Understand these instructions.  Get help right away if you are not doing well or get worse.    Thank you for letting us be a part of your medical care team.  It is a privilege we respect greatly.  We hope these instructions will help you stay on track for a fast and full recovery!

## 2023-02-19 NOTE — Evaluation (Signed)
Physical Therapy Evaluation Patient Details Name: Vickie Ward MRN: [id number] DOB: 15-Sep-1945 Today's Date: 02/19/2023  History of Present Illness  77 yo female presents to therapy s/p R TKA on 02/19/2023 due to failure of conservative measures. Pt PMH Includes but is not limited to: anemia, anxiety, cervical myelopathy, depression, DM II, GERD, factor V leiden, OS, L TKA (2019) and ACDF.  Clinical Impression     Vickie Ward is a 77 y.o. female POD 0 s/p R TKA. Patient reports IND with mobility at baseline. Patient is now limited by functional impairments (see PT problem list below) and requires Min A  for bed mobility and Min A  for transfers. Patient was able to ambulate 25 feet with RW and CGA and recliner close level of assist. Patient instructed in exercise to facilitate ROM and circulation to manage edema.  Patient will benefit from continued skilled PT interventions to address impairments and progress towards PLOF. Acute PT will follow to progress mobility and stair training in preparation for safe discharge home with family support and [facility] services.       If plan is discharge home, recommend the following: A little help with walking and/or transfers;A little help with bathing/dressing/bathroom;Assistance with cooking/housework;Assist for transportation;Help with stairs or ramp for entrance   Can travel by private vehicle        Equipment Recommendations None recommended by PT  Recommendations for Other Services       Functional Status Assessment Patient has had a recent decline in their functional status and demonstrates the ability to make significant improvements in function in a reasonable and predictable amount of time.     Precautions / Restrictions Precautions Precautions: Knee;Fall Restrictions Weight Bearing Restrictions: No      Mobility  Bed Mobility Overal bed mobility: Needs Assistance Bed Mobility: Supine to Sit     Supine to sit: Min assist,  HOB elevated, Used rails     General bed mobility comments: min A for R LE to EOB and cues    Transfers Overall transfer level: Needs assistance Equipment used: Rolling walker (2 wheels) Transfers: Sit to/from Stand Sit to Stand: Min assist, From elevated surface           General transfer comment: cues for proper UE placement and pt able to push with 1 UE from EOB min A to power up and noted R LE instabiltiy with initial standing    Ambulation/Gait Ambulation/Gait assistance: Contact guard assist Gait Distance (Feet): 25 Feet Assistive device: Rolling walker (2 wheels) Gait Pattern/deviations: Step-to pattern, Antalgic, Trunk flexed Gait velocity: decreased     General Gait Details: heavy reliance on B UE support at RW to offload R LE in stance phase with cues for sequencing and R LE advacement, no R LE instabiltiy noted however pt limting  R LE WB and no knee flexion in swing phase.  Stairs            Wheelchair Mobility     Tilt Bed    Modified Rankin (Stroke Patients Only)       Balance Overall balance assessment: Needs assistance Sitting-balance support: Feet supported Sitting balance-Leahy Scale: Good     Standing balance support: Bilateral upper extremity supported, During functional activity, Reliant on assistive device for balance Standing balance-Leahy Scale: Poor                               Pertinent Vitals/Pain Pain Assessment  Pain Assessment: 0-10 Pain Score: 3  Pain Location: R knee Pain Descriptors / Indicators: Aching, Constant, Discomfort, Operative site guarding Pain Intervention(s): Limited activity within patient's tolerance, Monitored during session, Premedicated before session, Repositioned, Ice applied    Home Living Family/patient expects to be discharged to:: Private residence Living Arrangements: Spouse/significant other Available Help at Discharge: Family Type of Home: House Home Access: Level entry        Home Layout: Multi-level;Able to live on main level with bedroom/bathroom (pt states she lives in an appartment in the basement of home) Home Equipment: Agricultural consultant (2 wheels);Cane - single point;Shower seat Additional Comments: pt reports living in the basement area of home in an appartment because she does not want to navigate steps    Prior Function Prior Level of Function : Independent/Modified Independent;Driving             Mobility Comments: IND no AD for all ADLs, self care tasks and IADLs       Extremity/Trunk Assessment        Lower Extremity Assessment Lower Extremity Assessment: RLE deficits/detail RLE Deficits / Details: ankle DF/PF: SLR with AA RLE Sensation: decreased light touch    Cervical / Trunk Assessment Cervical / Trunk Assessment: Normal  Communication   Communication Communication: No apparent difficulties  Cognition Arousal: Alert Behavior During Therapy: WFL for tasks assessed/performed Overall Cognitive Status: Within Functional Limits for tasks assessed                                          General Comments      Exercises Total Joint Exercises Ankle Circles/Pumps: AROM, Both, 10 reps   Assessment/Plan    PT Assessment Patient needs continued PT services  PT Problem List Decreased strength;Decreased range of motion;Decreased activity tolerance;Decreased balance;Decreased mobility;Decreased coordination;Pain       PT Treatment Interventions DME instruction;Gait training;Functional mobility training;Therapeutic activities;Therapeutic exercise;Balance training;Neuromuscular re-education;Patient/family education;Modalities    PT Goals (Current goals can be found in the Care Plan section)  Acute Rehab PT Goals Patient Stated Goal: to be able to get back to [place] in 10 days or less PT Goal Formulation: With patient Time For Goal Achievement: 03/12/23 Potential to Achieve Goals: Good    Frequency 7X/week      Co-evaluation               AM-PAC PT "6 Clicks" Mobility  Outcome Measure Help needed turning from your back to your side while in a flat bed without using bedrails?: A Little Help needed moving from lying on your back to sitting on the side of a flat bed without using bedrails?: A Little Help needed moving to and from a bed to a chair (including a wheelchair)?: A Little Help needed standing up from a chair using your arms (e.g., wheelchair or bedside chair)?: A Little Help needed to walk in hospital room?: A Little Help needed climbing 3-5 steps with a railing? : A Lot 6 Click Score: 17    End of Session Equipment Utilized During Treatment: Gait belt Activity Tolerance: Patient tolerated treatment well;No increased pain Patient left: in chair;with call bell/phone within reach Nurse Communication: Mobility status PT Visit Diagnosis: Unsteadiness on feet (R26.81);Other abnormalities of gait and mobility (R26.89);Muscle weakness (generalized) (M62.81);Difficulty in walking, not elsewhere classified (R26.2);Pain Pain - Right/Left: Right Pain - part of body: Knee;Leg    Time: 1610-9604 PT Time  Calculation (min) (ACUTE ONLY): 23 min   Charges:   PT Evaluation $PT Eval Low Complexity: 1 Low PT Treatments $Gait Training: 8-22 mins PT General Charges $$ ACUTE PT VISIT: 1 Visit         Johnny Bridge, PT Acute Rehab   Jacqualyn Posey 02/19/2023, 6:13 PM

## 2023-02-19 NOTE — Transfer of Care (Signed)
Immediate Anesthesia Transfer of Care Note  Patient: Vickie Ward  Procedure(s) Performed: TOTAL KNEE ARTHROPLASTY (Right: Knee)  Patient Location: PACU  Anesthesia Type:Spinal  Level of Consciousness: drowsy  Airway & Oxygen Therapy: Patient Spontanous Breathing and Patient connected to face mask  Post-op Assessment: Report given to RN  Post vital signs: Reviewed and stable  Last Vitals:  Vitals Value Taken Time  BP 126/69 02/19/23 1322  Temp    Pulse 67 02/19/23 1324  Resp 21 02/19/23 1324  SpO2 100 % 02/19/23 1324  Vitals shown include unfiled device data.  Last Pain:  Vitals:   02/19/23 0940  TempSrc:   PainSc: 0-No pain         Complications: No notable events documented.

## 2023-02-19 NOTE — Op Note (Signed)
Vickie Ward, Vickie Ward MEDICAL RECORD NO: [id number] ACCOUNT NO: [id number] DATE OF BIRTH: 1945/12/16 FACILITY: [facility] LOCATION: [facility] PHYSICIAN: Javier Docker, MD  Operative Report   DATE OF PROCEDURE: 02/19/2023  PREOPERATIVE DIAGNOSIS:  End-stage osteoarthrosis, varus deformity of the right knee.  POSTOPERATIVE DIAGNOSES:  End-stage osteoarthrosis, varus deformity of the right knee.  PROCEDURE PERFORMED:  Right total knee arthroplasty utilizing DePuy Attune rotating platform 5 femur, 5 tibia, 5 mm insert, 38 patella.  ANESTHESIA:  Spinal.  ASSISTANT:  Andrez Grime, PA.  HISTORY:  This is a pleasant 77 year old female with end-stage osteoarthrosis medial compartment of the right knee.  She is failing conservative treatment, was indicated for replacement of the degenerated joint.  Risks and benefits discussed including  bleeding, infection, damage to neurovascular structures, no change in symptoms, worsening symptoms, DVT, PE, anesthetic complications, etc.  DESCRIPTION OF PROCEDURE:  The patient in supine position, after induction of adequate spinal anesthesia, vancomycin and Levaquin the right lower extremity was prepped and draped and exsanguinated in usual sterile fashion.  Thigh tourniquet inflated to  225 mmHg.  Midline incision was then made over the knee.  Full thickness flaps developed.  Median parapatellar arthrotomy performed.  Patella was gently everted, knee was flexed.  The soft tissue elevated medially, preserving the MCL.  Tricompartmental  osteoarthrosis was noted, particularly in the medial compartment.  Remnants of the medial and lateral meniscus and ACL were excised.  A notch was fashioned above the femoral notch as a starting place for the femoral drill in line with the femur entered  it with a T-handle, irrigated.  Intramedullary guide, 5-degree right with 9 off the distal femur was selected and pinned, performed our cut.  Then, subluxed the tibia low side was  medially.  Eburnated bone.  External alignment guide throughout the defect  parallel to the shaft, 3 degrees slope, bisecting the tibiotalar joint.  This was pinned and performed a tibial cut.  Protecting soft tissues posteriorly at all times.  I then checked our extension gap and it was satisfactory at 5.  It was reflexed the  knee, sized the femur off the anterior cortex to be a 5, 3 degrees of external rotation, pinned, performed our block cut.  I placed our block and performed anterior, posterior and chamfer cuts.  Attention turned back to the tibia, sized to a 5 just the  medial aspect of tibial tubercle maximizing her coverage, pinned, harvested bone centrally impacted it in distal femur and drilled centrally placed our punching guide.  Turned attention back to the femur, performed a box cut and this bisecting the  condyle and femoral canal.  Following this, we then performed the cut.  I used a rasp.  I then placed a trial femur it fit flush.  Drilled our lug holes.  I used a 5 mm insert, reduced the knee and had full extension, full flexion, good stability to  varus valgus stress at 0-30 degrees, negative anterior drawer.  Everted the patella, measured it to a 21, planned it to a 14 utilizing the patellar jig.  I sized it to a 35 with a paddle parallel to the joint surface, drilled our peg holes, placed a  trial patella and reduced that and had excellent patellofemoral tracking.  All instrumentation was removed.  Checked posteriorly capsule was intact.  Popliteus was intact.  Cauterized our geniculates.  Pulsatile lavage was utilized to clean all bony  surfaces.  Knee was flexed.  All surfaces thoroughly dried.  Drill  holes in the medial tibial plateau where there was eburnated bone, mixed cement on back table under vacuum.  I then placed cement in the proximal tibia, digitally pressurizing it.  Cement  was placed on the tibial tray and impacted the tibial tray.  Redundant cement removed.  We cemented  the femoral component with cement on the femur and the component, impacted into place fit flush.  I placed a 5 insert, reduced it, held axial load  throughout the curing the cement with the knee in extension.  Redundant cement removed, I cemented and clamped the patella.  Marcaine with epinephrine was placed in the wound during the curing of the cement for hemostasis and Prontosan.  Wound was  covered.  After appropriate curing of the cement, tourniquet was deflated at 55 minutes.  Any bleeding that was present was cauterized.  Following this, we had excellent stability.  I removed the 5 insert, meticulously removed all redundant cement.   Copiously irrigated with pulsatile lavage and Prontosan and selected a permanent 5 insert, reduced it and had full extension, full flexion, good stability to varus valgus stressing at 0 and 30 degrees, negative anterior drawer.  With these in mid flexion  I reapproximated patellar arthrotomy with 1 Vicryl in interrupted figure-of-eight sutures followed by running Stratafix.  Copiously irrigated the subcutaneous tissue, subcutaneous was closed with 2-0 and skin with subcuticular Monocryl.  Sterile  dressing was applied.  Placed in immobilizer and transported to the recovery room in satisfactory condition.  The patient tolerated the procedure well.  No complications.  ASSISTANT:  Andrez Grime, PA, was used throughout the case for patient positioning, retraction, exposure and closure.  BLOOD LOSS:  50 mL.   PUS D: 02/19/2023 1:01:44 pm T: 02/19/2023 2:51:00 pm  JOB: [id number]/ [id number]

## 2023-02-19 NOTE — Interval H&P Note (Signed)
History and Physical Interval Note:  02/19/2023 10:08 AM  Vickie Ward  has presented today for surgery, with the diagnosis of Right knee degenerative joint disease.  The various methods of treatment have been discussed with the patient and family. After consideration of risks, benefits and other options for treatment, the patient has consented to  Procedure(s): TOTAL KNEE ARTHROPLASTY (Right) as a surgical intervention.  The patient's history has been reviewed, patient examined, no change in status, stable for surgery.  I have reviewed the patient's chart and labs.  Questions were answered to the patient's satisfaction.     Javier Docker

## 2023-02-20 ENCOUNTER — Encounter (HOSPITAL_COMMUNITY): Payer: Self-pay | Admitting: Specialist

## 2023-02-20 ENCOUNTER — Other Ambulatory Visit (HOSPITAL_COMMUNITY): Payer: Self-pay

## 2023-02-20 DIAGNOSIS — M1711 Unilateral primary osteoarthritis, right knee: Secondary | ICD-10-CM | POA: Diagnosis not present

## 2023-02-20 LAB — BASIC METABOLIC PANEL
Anion gap: 7 (ref 5–15)
BUN: 20 mg/dL (ref 8–23)
CO2: 22 mmol/L (ref 22–32)
Calcium: 8.7 mg/dL — ABNORMAL LOW (ref 8.9–10.3)
Chloride: 105 mmol/L (ref 98–111)
Creatinine, Ser: 0.7 mg/dL (ref 0.44–1.00)
GFR, Estimated: >60 mL/min (ref >60)
Glucose, Bld: 212 mg/dL — ABNORMAL HIGH (ref 70–99)
Potassium: 4.7 mmol/L (ref 3.5–5.1)
Sodium: 134 mmol/L — ABNORMAL LOW (ref 135–145)

## 2023-02-20 LAB — CBC
HCT: 30.1 % — ABNORMAL LOW (ref 36.0–46.0)
Hemoglobin: 9 g/dL — ABNORMAL LOW (ref 12.0–15.0)
MCH: 22.8 pg — ABNORMAL LOW (ref 26.0–34.0)
MCHC: 29.9 g/dL — ABNORMAL LOW (ref 30.0–36.0)
MCV: 76.2 fL — ABNORMAL LOW (ref 80.0–100.0)
Platelets: 239 10*3/uL (ref 150–400)
RBC: 3.95 MIL/uL (ref 3.87–5.11)
RDW: 18.1 % — ABNORMAL HIGH (ref 11.5–15.5)
WBC: 11.1 10*3/uL — ABNORMAL HIGH (ref 4.0–10.5)
nRBC: 0 % (ref 0.0–0.2)

## 2023-02-20 LAB — GLUCOSE, CAPILLARY
Glucose-Capillary: 192 mg/dL — ABNORMAL HIGH (ref 70–99)
Glucose-Capillary: 173 mg/dL — ABNORMAL HIGH (ref 70–99)

## 2023-02-20 MED ORDER — MUPIROCIN 2 % EX OINT
TOPICAL_OINTMENT | Freq: Two times a day (BID) | CUTANEOUS | Status: DC
  Filled 2023-02-20: qty 22

## 2023-02-20 NOTE — Plan of Care (Signed)
  Problem: Coping: Goal: Level of anxiety will decrease Outcome: Progressing   Problem: Pain Management: Goal: General experience of comfort will improve Outcome: Progressing   Problem: Safety: Goal: Ability to remain free from injury will improve Outcome: Progressing

## 2023-02-20 NOTE — Progress Notes (Signed)
Patient is being discharged home. All discharge instructions reviewed with patient including new medications and post op instructions. Patient verbalized a full understanding of instructions. Patients husband is her ride home.

## 2023-02-20 NOTE — Progress Notes (Signed)
Physical Therapy Treatment Patient Details Name: Vickie Ward MRN: [id number] DOB: 08-Mar-1946 Today's Date: 02/20/2023   History of Present Illness 77 yo female presents to therapy s/p R TKA on 02/19/2023 due to failure of conservative measures. Pt PMH Includes but is not limited to: anemia, anxiety, cervical myelopathy, depression, DM II, GERD, factor V leiden, OS, L TKA (2019) and ACDF.    PT Comments  Pt very cooperative but mobility limited this am by c/o fatigue/feeling hot and notable instability.  Bp 182/77 with RN aware.    If plan is discharge home, recommend the following: A little help with walking and/or transfers;A little help with bathing/dressing/bathroom;Assistance with cooking/housework;Assist for transportation;Help with stairs or ramp for entrance   Can travel by private vehicle        Equipment Recommendations  None recommended by PT    Recommendations for Other Services       Precautions / Restrictions Precautions Precautions: Knee;Fall Restrictions Weight Bearing Restrictions: No RLE Weight Bearing: Weight bearing as tolerated     Mobility  Bed Mobility Overal bed mobility: Needs Assistance Bed Mobility: Supine to Sit     Supine to sit: Contact guard     General bed mobility comments: for safety    Transfers Overall transfer level: Needs assistance Equipment used: Rolling walker (2 wheels) Transfers: Sit to/from Stand Sit to Stand: Min assist, From elevated surface           General transfer comment: cues for LE management and use of UEs to self assist.  Steady assist for balance    Ambulation/Gait Ambulation/Gait assistance: Min assist, Contact guard assist Gait Distance (Feet): 5 Feet Assistive device: Rolling walker (2 wheels) Gait Pattern/deviations: Step-to pattern, Antalgic, Trunk flexed Gait velocity: decreased     General Gait Details: cues for sequence, posture and position from RW; pt notably shaky and unsteady and  distance limited to bed to chair for safety   Stairs             Wheelchair Mobility     Tilt Bed    Modified Rankin (Stroke Patients Only)       Balance Overall balance assessment: Needs assistance Sitting-balance support: Feet supported Sitting balance-Leahy Scale: Good     Standing balance support: Bilateral upper extremity supported, During functional activity, Reliant on assistive device for balance Standing balance-Leahy Scale: Poor                              Cognition Arousal: Alert Behavior During Therapy: WFL for tasks assessed/performed Overall Cognitive Status: Within Functional Limits for tasks assessed                                          Exercises Total Joint Exercises Ankle Circles/Pumps: AROM, Both, 15 reps, Supine Quad Sets: AROM, Both, 10 reps, Supine Heel Slides: AAROM, Right, 15 reps, Supine Straight Leg Raises: AAROM, AROM, Right, Supine, 15 reps    General Comments        Pertinent Vitals/Pain Pain Assessment Pain Assessment: 0-10 Pain Score: 3  Pain Location: R knee Pain Descriptors / Indicators: Aching, Constant, Discomfort, Operative site guarding Pain Intervention(s): Limited activity within patient's tolerance, Monitored during session, Premedicated before session, Ice applied    Home Living  Prior Function            PT Goals (current goals can now be found in the care plan section) Acute Rehab PT Goals Patient Stated Goal: to be able to get back to [place] in 10 days or less PT Goal Formulation: With patient Time For Goal Achievement: 03/12/23 Potential to Achieve Goals: Good Progress towards PT goals: Not progressing toward goals - comment (shaky, unsteady, hot)    Frequency    7X/week      PT Plan      Co-evaluation              AM-PAC PT "6 Clicks" Mobility   Outcome Measure  Help needed turning from your back to your side  while in a flat bed without using bedrails?: A Little Help needed moving from lying on your back to sitting on the side of a flat bed without using bedrails?: A Little Help needed moving to and from a bed to a chair (including a wheelchair)?: A Little Help needed standing up from a chair using your arms (e.g., wheelchair or bedside chair)?: A Little Help needed to walk in hospital room?: A Lot Help needed climbing 3-5 steps with a railing? : A Lot 6 Click Score: 16    End of Session Equipment Utilized During Treatment: Gait belt Activity Tolerance: Patient tolerated treatment well;No increased pain Patient left: in chair;with call bell/phone within reach Nurse Communication: Mobility status PT Visit Diagnosis: Unsteadiness on feet (R26.81);Other abnormalities of gait and mobility (R26.89);Muscle weakness (generalized) (M62.81);Difficulty in walking, not elsewhere classified (R26.2);Pain Pain - Right/Left: Right Pain - part of body: Knee;Leg     Time: 8657-8469 PT Time Calculation (min) (ACUTE ONLY): 32 min  Charges:    $Therapeutic Exercise: 8-22 mins $Therapeutic Activity: 8-22 mins PT General Charges $$ ACUTE PT VISIT: 1 Visit                     Mauro Kaufmann PT Acute Rehabilitation Services Pager [phone] Office [phone]    [facility] 02/20/2023, 2:37 PM

## 2023-02-20 NOTE — Progress Notes (Signed)
Subjective: 1 Day Post-Op Procedure(s) (LRB): TOTAL KNEE ARTHROPLASTY (Right) Patient reports pain as 4 on 0-10 scale.   Denies CP or SOB.  Voiding without difficulty. Positive flatus. Objective: Vital signs in last 24 hours: Temp:  [97.5 F (36.4 C)-98.6 F (37 C)] 98 F (36.7 C) (10/24 0151) Pulse Rate:  [58-75] 66 (10/24 0530) Resp:  [14-22] 18 (10/24 0530) BP: (126-216)/(63-92) 168/88 (10/24 0530) SpO2:  [92 %-100 %] 100 % (10/24 0530) Weight:  [77.5 kg] 77.5 kg (10/23 1600)  Intake/Output from previous day: 10/23 0701 - 10/24 0700 In: 2567.5 [P.O.:1440; I.V.:927.4; IV Piggyback:200.1] Out: 2795 [Urine:2775; Blood:20] Intake/Output this shift: Total I/O In: -  Out: 10 [Urine:10]  Recent Labs    02/20/23 0341  HGB 9.0*   Recent Labs    02/20/23 0341  WBC 11.1*  RBC 3.95  HCT 30.1*  PLT 239   Recent Labs    02/20/23 0341  NA 134*  K 4.7  CL 105  CO2 22  BUN 20  CREATININE 0.70  GLUCOSE 212*  CALCIUM 8.7*   No results for input(s): "LABPT", "INR" in the last 72 hours.  Neurologically intact ABD soft Neurovascular intact Sensation intact distally Intact pulses distally Dorsiflexion/Plantar flexion intact Incision: dressing C/D/I Compartment soft  Assessment/Plan:  1 Day Post-Op Procedure(s) (LRB): TOTAL KNEE ARTHROPLASTY (Right) Advance diet Up with therapy Discharge home with home health   Principal Problem:   Right knee DJD

## 2023-02-20 NOTE — Anesthesia Postprocedure Evaluation (Signed)
Anesthesia Post Note  Patient: Vickie Ward  Procedure(s) Performed: TOTAL KNEE ARTHROPLASTY (Right: Knee)     Patient location during evaluation: PACU Anesthesia Type: Spinal Level of consciousness: awake, awake and alert and oriented Pain management: pain level controlled Vital Signs Assessment: post-procedure vital signs reviewed and stable Respiratory status: spontaneous breathing, nonlabored ventilation and respiratory function stable Cardiovascular status: blood pressure returned to baseline and stable Postop Assessment: no headache, no backache, spinal receding and no apparent nausea or vomiting Anesthetic complications: no   No notable events documented.  Last Vitals:  Vitals:   02/20/23 0530 02/20/23 0813  BP: (!) 168/88 (!) 177/82  Pulse: 66 67  Resp: 18 20  Temp:  36.4 C  SpO2: 100% 97%    Last Pain:  Vitals:   02/20/23 0813  TempSrc: Oral  PainSc:    Pain Goal:                   Collene Schlichter

## 2023-02-20 NOTE — Progress Notes (Signed)
Physical Therapy Treatment Patient Details Name: Vickie Ward MRN: [id number] DOB: January 09, 1946 Today's Date: 02/20/2023   History of Present Illness 77 yo female presents to therapy s/p R TKA on 02/19/2023 due to failure of conservative measures. Pt PMH Includes but is not limited to: anemia, anxiety, cervical myelopathy, depression, DM II, GERD, factor V leiden, OS, L TKA (2019) and ACDF.    PT Comments  Pt continues very cooperative and with noted improvement in activity tolerance.  Pt up to ambulate in hall x 72' - distance limited by c/o feeling hot - pt reports similar episodes at home.  RN aware.    If plan is discharge home, recommend the following: A little help with walking and/or transfers;A little help with bathing/dressing/bathroom;Assistance with cooking/housework;Assist for transportation;Help with stairs or ramp for entrance   Can travel by private vehicle        Equipment Recommendations  None recommended by PT    Recommendations for Other Services       Precautions / Restrictions Precautions Precautions: Knee;Fall Restrictions Weight Bearing Restrictions: No RLE Weight Bearing: Weight bearing as tolerated     Mobility  Bed Mobility Overal bed mobility: Needs Assistance Bed Mobility: Supine to Sit     Supine to sit: Contact guard     General bed mobility comments: Pt up in chair and requests back to same    Transfers Overall transfer level: Needs assistance Equipment used: Rolling walker (2 wheels) Transfers: Sit to/from Stand Sit to Stand: Contact guard assist, Supervision           General transfer comment: cues for LE management and use of UEs to self assist.  Steady assist for balance    Ambulation/Gait Ambulation/Gait assistance: Contact guard assist Gait Distance (Feet): 72 Feet Assistive device: Rolling walker (2 wheels) Gait Pattern/deviations: Step-to pattern, Antalgic, Trunk flexed Gait velocity: decreased     General Gait  Details: cues for sequence, posture and position from RW;   Stairs             Wheelchair Mobility     Tilt Bed    Modified Rankin (Stroke Patients Only)       Balance Overall balance assessment: Needs assistance Sitting-balance support: Feet supported Sitting balance-Leahy Scale: Good     Standing balance support: Bilateral upper extremity supported, During functional activity, Reliant on assistive device for balance Standing balance-Leahy Scale: Poor                              Cognition Arousal: Alert Behavior During Therapy: WFL for tasks assessed/performed Overall Cognitive Status: Within Functional Limits for tasks assessed                                          Exercises Total Joint Exercises Ankle Circles/Pumps: AROM, Both, 15 reps, Supine Quad Sets: AROM, Both, 10 reps, Supine Heel Slides: AAROM, Right, 15 reps, Supine Straight Leg Raises: AAROM, AROM, Right, Supine, 15 reps    General Comments        Pertinent Vitals/Pain Pain Assessment Pain Assessment: 0-10 Pain Score: 3  Pain Location: R knee Pain Descriptors / Indicators: Aching, Constant, Discomfort, Operative site guarding Pain Intervention(s): Limited activity within patient's tolerance, Monitored during session, Premedicated before session, Ice applied    Home Living  Prior Function            PT Goals (current goals can now be found in the care plan section) Acute Rehab PT Goals Patient Stated Goal: to be able to get back to [place] in 10 days or less PT Goal Formulation: With patient Time For Goal Achievement: 03/12/23 Potential to Achieve Goals: Good Progress towards PT goals: Progressing toward goals    Frequency    7X/week      PT Plan      Co-evaluation              AM-PAC PT "6 Clicks" Mobility   Outcome Measure  Help needed turning from your back to your side while in a flat bed without  using bedrails?: A Little Help needed moving from lying on your back to sitting on the side of a flat bed without using bedrails?: A Little Help needed moving to and from a bed to a chair (including a wheelchair)?: A Little Help needed standing up from a chair using your arms (e.g., wheelchair or bedside chair)?: A Little Help needed to walk in hospital room?: A Little Help needed climbing 3-5 steps with a railing? : A Lot 6 Click Score: 17    End of Session Equipment Utilized During Treatment: Gait belt Activity Tolerance: Patient tolerated treatment well;No increased pain Patient left: in chair;with call bell/phone within reach Nurse Communication: Mobility status PT Visit Diagnosis: Unsteadiness on feet (R26.81);Other abnormalities of gait and mobility (R26.89);Muscle weakness (generalized) (M62.81);Difficulty in walking, not elsewhere classified (R26.2);Pain Pain - Right/Left: Right Pain - part of body: Knee;Leg     Time: 4259-5638 PT Time Calculation (min) (ACUTE ONLY): 27 min  Charges:    $Gait Training: 23-37 mins $Therapeutic Exercise: 8-22 mins $Therapeutic Activity: 8-22 mins PT General Charges $$ ACUTE PT VISIT: 1 Visit                     Mauro Kaufmann PT Acute Rehabilitation Services Pager [phone] Office [phone]    Magic Mohler 02/20/2023, 2:41 PM

## 2023-02-20 NOTE — TOC Transition Note (Signed)
Transition of Care [facility]) - CM/SW Discharge Note  Patient Details  Name: Vickie Ward MRN: [id number] Date of Birth: 08-06-1945  [facility] CM/SW Contact:  Ewing Schlein, LCSW Phone Number: 02/20/2023, 10:07 AM  Clinical Narrative: Patient is expected to discharge home after working with PT. CSW met with patient to confirm discharge plan and needs. Patient will go home with HHPT, which was prearranged with [facility]. Patient has a rolling walker at home, so there are no DME needs at this time. TOC signing off.    Final next level of care: Home w Home Health Services Barriers to Discharge: No Barriers Identified  Patient Goals and CMS Choice CMS Medicare.gov Compare Post Acute Care list provided to:: Patient Choice offered to / list presented to : Patient  Discharge Plan and Services Additional resources added to the After Visit Summary for          DME Arranged: N/A DME Agency: NA HH Arranged: PT HH Agency: Other - See comment Producer, television/film/video) Representative spoke with at [facility] Agency: Prearranged in orthopedist's office  Social Determinants of Health (SDOH) Interventions SDOH Screenings   Food Insecurity: No Food Insecurity (02/19/2023)  Housing: Low Risk  (02/19/2023)  Transportation Needs: No Transportation Needs (02/19/2023)  Utilities: Not At Risk (02/19/2023)  Depression (PHQ2-9): Medium Risk (08/13/2021)  Tobacco Use: Low Risk  (02/19/2023)   Readmission Risk Interventions     No data to display

## 2023-04-25 ENCOUNTER — Encounter: Payer: Self-pay | Admitting: Nurse Practitioner

## 2023-04-25 ENCOUNTER — Ambulatory Visit (INDEPENDENT_AMBULATORY_CARE_PROVIDER_SITE_OTHER): Payer: Medicare Other | Admitting: Nurse Practitioner

## 2023-04-25 VITALS — BP 139/68 | HR 62 | Ht 63.0 in | Wt 164.4 lb

## 2023-04-25 DIAGNOSIS — E782 Mixed hyperlipidemia: Secondary | ICD-10-CM

## 2023-04-25 DIAGNOSIS — I10 Essential (primary) hypertension: Secondary | ICD-10-CM

## 2023-04-25 DIAGNOSIS — E559 Vitamin D deficiency, unspecified: Secondary | ICD-10-CM

## 2023-04-25 DIAGNOSIS — E119 Type 2 diabetes mellitus without complications: Secondary | ICD-10-CM

## 2023-04-25 DIAGNOSIS — Z7984 Long term (current) use of oral hypoglycemic drugs: Secondary | ICD-10-CM | POA: Diagnosis not present

## 2023-04-25 LAB — POCT GLYCOSYLATED HEMOGLOBIN (HGB A1C): Hemoglobin A1C: 7.3 % — AB (ref 4.0–5.6)

## 2023-04-25 MED ORDER — GLIPIZIDE ER 2.5 MG PO TB24: 2.5000 mg | ORAL_TABLET | Freq: Every day | ORAL | 1 refills | Status: DC

## 2023-04-25 MED ORDER — METFORMIN HCL ER 750 MG PO TB24: 750.0000 mg | ORAL_TABLET | Freq: Every day | ORAL | 1 refills | Status: DC

## 2023-04-25 NOTE — Progress Notes (Signed)
Endocrinology Follow Up Note       04/25/2023, 8:59 AM   Subjective:    Patient ID: Vickie Ward, female    DOB: [birth year].  Vickie Ward is being seen in follow up after being seen in consultation for management of currently uncontrolled symptomatic diabetes requested by  Garald Braver.   Past Medical History:  Diagnosis Date   A-fib (HCC)    Anemia    Anxiety    Arthritis    Cervical myelopathy (HCC) 02/04/2018   Depression    Diabetes mellitus without complication (HCC)    Factor V Leiden (HCC)    GERD (gastroesophageal reflux disease)    pt takes OTC meds   Headache(784.0)    Insomnia    Numbness and tingling    in L arm from neck issues   Osteoporosis    Overactive bladder    takes Vesicare   PONV (postoperative nausea and vomiting)    RBBB    Sleep apnea    sleep study done > 2yrs ago;no cpap and since lost weight much improvement   UTI (lower urinary tract infection)    currently taking Cipro   Visual floaters     Past Surgical History:  Procedure Laterality Date   ABDOMINAL HYSTERECTOMY     ANTERIOR CERVICAL DECOMP/DISCECTOMY FUSION N/A 03/18/2013   Procedure: ACDF C4 - C7 3 LEVELS;  Surgeon: Venita Lick, MD;  Location: [facility];  Service: Orthopedics;  Laterality: N/A;   COLONOSCOPY     DILATION AND CURETTAGE OF UTERUS     REPLACEMENT TOTAL KNEE     2019   TONSILLECTOMY     adenoidectomy   TOTAL KNEE ARTHROPLASTY Right 02/19/2023   Procedure: TOTAL KNEE ARTHROPLASTY;  Surgeon: Jene Every, MD;  Location: [facility];  Service: Orthopedics;  Laterality: Right;   UPPER GI ENDOSCOPY      Social History   Socioeconomic History   Marital status: Married    Spouse name: Not on file   Number of children: Not on file   Years of education: Not on file   Highest education level: Not on file  Occupational History   Occupation: Retired  Tobacco Use   Smoking status: Never    Smokeless tobacco: Never  Vaping Use   Vaping status: Never Used  Substance and Sexual Activity   Alcohol use: Yes    Alcohol/week: 1.0 - 2.0 standard drink of alcohol    Types: 1 - 2 Glasses of wine per week    Comment: daily   Drug use: Never   Sexual activity: Not on file  Other Topics Concern   Not on file  Social History Narrative   Not on file   Social Drivers of Health   Financial Resource Strain: Not on file  Food Insecurity: No Food Insecurity (02/19/2023)   Hunger Vital Sign    Worried About Running Out of Food in the Last Year: Never true    Ran Out of Food in the Last Year: Never true  Transportation Needs: No Transportation Needs (02/19/2023)   PRAPARE - Administrator, Civil Service (Medical): No    Lack of Transportation (Non-Medical): No  Physical Activity: Not on file  Stress: Not on file  Social Connections: Not on file    Family History  Problem Relation Age of Onset   Heart attack Mother    Hypertension Mother    Diabetes type II Mother    High Cholesterol Mother    Hyperlipidemia Mother    Heart failure Mother    Diabetes type II Father    Lung cancer Father    Hypertension Sister    Vascular Disease Sister    Diabetes type II Sister    COPD Brother    Drug abuse Brother    Drug abuse Brother    Stomach cancer Brother     Outpatient Encounter Medications as of 04/25/2023  Medication Sig   aspirin EC 81 MG tablet Take 1 tablet (81 mg total) by mouth 2 (two) times daily after a meal. Start the day after surgery.   Cholecalciferol (VITAMIN D) 50 MCG (2000 UT) CAPS Take 2,000 Units by mouth daily.   cyanocobalamin (,VITAMIN B-12,) 1000 MCG/ML injection Inject 1,000 mcg into the muscle every 30 (thirty) days.   cyanocobalamin 1000 MCG tablet Take 1,000 mcg by mouth daily.   docusate sodium (COLACE) 100 MG capsule Take 1 capsule (100 mg total) by mouth 2 (two) times daily.   glucose blood (ONETOUCH ULTRA) test strip Use as  instructed to monitor glucose twice daily   hydroxychloroquine (PLAQUENIL) 200 MG tablet Take 200 mg by mouth daily.   Krill Oil 500 MG CAPS Take 500 mg by mouth daily.   magnesium oxide (MAG-OX) 400 (240 Mg) MG tablet Take 400 mg by mouth 2 (two) times daily.   Melatonin-Pyridoxine 10-10 MG TBCR Take 1 tablet by mouth daily as needed (sleep).   methocarbamol (ROBAXIN) 500 MG tablet Take 1 tablet (500 mg total) by mouth every 8 (eight) hours as needed for muscle spasms.   nitroGLYCERIN (NITROSTAT) 0.4 MG SL tablet nitroglycerin 0.4 mg sublingual tablet  TABLET 0.4MG  UNDER THE TONGUE EVERY FIVE MINUTES AS NEEDED FOR CHEST PAIN. DO NOT EXCEED THREE DOSES IN 15 MINUTES   oxybutynin (DITROPAN-XL) 5 MG 24 hr tablet Take 5 mg by mouth every morning.   oxyCODONE (OXY IR/ROXICODONE) 5 MG immediate release tablet Take 1 tablet (5 mg total) by mouth every 4 (four) hours as needed for severe pain (pain score 7-10).   polyethylene glycol powder (GLYCOLAX/MIRALAX) 17 GM/SCOOP powder Mix 17 g in 4 oz of water or juice & drink by mouth daily.   ramipril (ALTACE) 2.5 MG capsule Take 2.5 mg by mouth daily.   rosuvastatin (CRESTOR) 5 MG tablet Take 5 mg by mouth daily.   zinc gluconate 50 MG tablet Take 50 mg by mouth daily.   zolpidem (AMBIEN) 10 MG tablet Take 10 mg by mouth at bedtime.   [DISCONTINUED] glipiZIDE (GLUCOTROL XL) 2.5 MG 24 hr tablet Take 1 tablet (2.5 mg total) by mouth daily with breakfast.   [DISCONTINUED] metFORMIN (GLUCOPHAGE-XR) 750 MG 24 hr tablet Take 1 tablet (750 mg total) by mouth daily with breakfast.   estradiol (ESTRACE) 0.1 MG/GM vaginal cream estradiol 0.01% (0.1 mg/gram) vaginal cream  apply a pea sized AMOUNT VAGINALLY (bladder opening at the vagina) AS DIRECTED NIGHTLY FOR SIX WEEKS, THEN TWICE A WEEK FOR maintenance (Patient not taking: Reported on 04/25/2023)   glipiZIDE (GLUCOTROL XL) 2.5 MG 24 hr tablet Take 1 tablet (2.5 mg total) by mouth daily with breakfast.   metFORMIN  (GLUCOPHAGE-XR) 750 MG 24 hr tablet Take 1  tablet (750 mg total) by mouth daily with breakfast.   No facility-administered encounter medications on file as of 04/25/2023.    ALLERGIES: Allergies  Allergen Reactions   Cephalexin Other (See Comments) and Rash    Per patient, may have caused itching or swelling Other reaction(s): Other (See Comments) Pt. States of the face Other Reaction: Other reaction Per patient, may have caused itching or swelling    Codeine Nausea And Vomiting   Nitrofurantoin Rash    Other reaction(s): Other (See Comments) Mouth Ulcers    Macrobid [Nitrofurantoin Macrocrystal] Other (See Comments)    Per patient, may have caused itching or swelling   Sulfamethoxazole-Trimethoprim     Other reaction(s): Other (See Comments) Oral thrush    VACCINATION STATUS:  There is no immunization history on file for this patient.  Diabetes She presents for her follow-up diabetic visit. She has type 2 diabetes mellitus. Onset time: Diagnosed at approx age of 12. Her disease course has been improving. There are no hypoglycemic associated symptoms. Associated symptoms include fatigue. Pertinent negatives for diabetes include no weakness. There are no hypoglycemic complications. Symptoms are stable. There are no diabetic complications. Risk factors for coronary artery disease include diabetes mellitus, dyslipidemia, family history, hypertension, stress, sedentary lifestyle and post-menopausal. Current diabetic treatment includes oral agent (monotherapy). She is compliant with treatment most of the time. Her weight is decreasing steadily. She is following a generally unhealthy diet. When asked about meal planning, she reported none. She has not had a previous visit with a dietitian. She rarely participates in exercise. Her breakfast blood glucose range is generally >200 mg/dl. Her bedtime blood glucose range is generally 140-180 mg/dl. (She presents today with her meter and logs,  showing above target glycemic profile overall.  Her POCT A1c today is 7.3%,improving from last visit of 7.6%.  She denies any hypoglycemia.  She has had a TKR since last visit, is currently battling sinus issues and admits to using cough syrups to help feel better which may be causing her glucose to be higher.  She also states she has had more family stress lately.) An ACE inhibitor/angiotensin II receptor blocker is being taken. She does not see a podiatrist.Eye exam is current.     Review of systems  Constitutional: + Minimally fluctuating body weight, current Body mass index is 29.12 kg/m., + fatigue- says these come in waves, no subjective hypothermia Eyes: no blurry vision, no xerophthalmia ENT: no sore throat, no nodules palpated in throat, no dysphagia/odynophagia, no hoarseness Cardiovascular: no chest pain, no shortness of breath, no palpitations, no leg swelling Respiratory: no cough, no shortness of breath Gastrointestinal: + intermittent nausea/vomiting/diarrhea Musculoskeletal: knee stiffness, recent R TKR Skin: no rashes, no hyperemia Neurological: no tremors, no numbness, no tingling, + intermittent dizziness Psychiatric: + depression, + anxiety, + insomnia, + increased stress  Objective:     BP 139/68 (BP Location: Left Arm, Patient Position: Sitting, Cuff Size: Large)   Pulse 62   Ht 5\' 3"  (1.6 m)   Wt 164 lb 6.4 oz (74.6 kg)   BMI 29.12 kg/m   Wt Readings from Last 3 Encounters:  04/25/23 164 lb 6.4 oz (74.6 kg)  02/19/23 170 lb 13.7 oz (77.5 kg)  02/07/23 171 lb 6.4 oz (77.7 kg)     BP Readings from Last 3 Encounters:  04/25/23 139/68  02/20/23 (!) 155/62  02/07/23 (!) 159/87      Physical Exam- Limited  Constitutional:  Body mass index is 29.12 kg/m. , not  in acute distress, normal state of mind Eyes:  EOMI, no exophthalmos Musculoskeletal: no gross deformities, strength intact in all four extremities, no gross restriction of joint movements Skin:  no  rashes, no hyperemia Neurological: no tremor with outstretched hands   Diabetic Foot Exam - Simple   No data filed     CMP ( most recent) CMP     Component Value Date/Time   NA 134 (L) 02/20/2023 0341   K 4.7 02/20/2023 0341   CL 105 02/20/2023 0341   CO2 22 02/20/2023 0341   GLUCOSE 212 (H) 02/20/2023 0341   BUN 20 02/20/2023 0341   BUN 21 12/17/2021 0000   CREATININE 0.70 02/20/2023 0341   CALCIUM 8.7 (L) 02/20/2023 0341   GFRNONAA >60 02/20/2023 0341     Diabetic Labs (most recent): Lab Results  Component Value Date   HGBA1C 7.3 (A) 04/25/2023   HGBA1C 7.6 (A) 01/24/2023   HGBA1C 7.5 (A) 09/12/2022   MICROALBUR 0.9 12/17/2021   MICROALBUR 10 07/31/2021     Lipid Panel ( most recent) Lipid Panel     Component Value Date/Time   TRIG 145 12/17/2021 0000   LDLCALC 110 12/17/2021 0000      Lab Results  Component Value Date   TSH 1.510 07/17/2021   FREET4 1.05 07/17/2021           Assessment & Plan:   1) Type 2 diabetes mellitus without complication, without long-term current use of insulin (HCC)  She presents today with her meter and logs, showing above target glycemic profile overall.  Her POCT A1c today is 7.3%,improving from last visit of 7.6%.  She denies any hypoglycemia.  She has had a TKR since last visit, is currently battling sinus issues and admits to using cough syrups to help feel better which may be causing her glucose to be higher.  She also states she has had more family stress lately.  - Vickie Ward has currently uncontrolled symptomatic type 2 DM since 77 years of age.   -Recent labs reviewed.  - I had a long discussion with her about the progressive nature of diabetes and the pathology behind its complications. -her diabetes is not currently complicated but she remains at a high risk for more acute and chronic complications which include CAD, CVA, CKD, retinopathy, and neuropathy. These are all discussed in detail with her.  The  following Lifestyle Medicine recommendations according to American College of Lifestyle Medicine [facility]) were discussed and offered to patient and she agrees to start the journey:  A. Whole Foods, Plant-based plate comprising of fruits and vegetables, plant-based proteins, whole-grain carbohydrates was discussed in detail with the patient.   A list for source of those nutrients were also provided to the patient.  Patient will use only water or unsweetened tea for hydration. B.  The need to stay away from risky substances including alcohol, smoking; obtaining 7 to 9 hours of restorative sleep, at least 150 minutes of moderate intensity exercise weekly, the importance of healthy social connections,  and stress reduction techniques were discussed. C.  A full color page of  Calorie density of various food groups per pound showing examples of each food groups was provided to the patient.  - Nutritional counseling repeated at each appointment due to patients tendency to fall back in to old habits.  - The patient admits there is a room for improvement in their diet and drink choices. -  Suggestion is made for the patient to avoid  simple carbohydrates from their diet including Cakes, Sweet Desserts / Pastries, Ice Cream, Soda (diet and regular), Sweet Tea, Candies, Chips, Cookies, Sweet Pastries, Store Bought Juices, Alcohol in Excess of 1-2 drinks a day, Artificial Sweeteners, Coffee Creamer, and "Sugar-free" Products. This will help patient to have stable blood glucose profile and potentially avoid unintended weight gain.   - I encouraged the patient to switch to unprocessed or minimally processed complex starch and increased protein intake (animal or plant source), fruits, and vegetables.   - Patient is advised to stick to a routine mealtimes to eat 3 meals a day and avoid unnecessary snacks (to snack only to correct hypoglycemia).  - she is being followed by Norm Salt, RDN, CDE for diabetes education-  had an appointment with her today.  - I have approached her with the following individualized plan to manage her diabetes and patient agrees:   -She is advised to continue her Metformin 750 mg ER po daily with breakfast and Glipizide 2.5 mg XL daily with breakfast (may look to increase at next visit).  She has tolerated it well despite known allergy to sulfa meds.    -she is encouraged to continue monitoring blood glucose at least twice daily daily, before breakfast and before bed, and to call the clinic if she has readings less than 70 or above 300 for 3 tests in a row.    - Adjustment parameters are given to her for hypo and hyperglycemia in writing.  - she will be considered for incretin therapy as appropriate next visit, although given her significant GI symptoms, she may not tolerate.  - Specific targets for  A1c; LDL, HDL, and Triglycerides were discussed with the patient.  2) Blood Pressure /Hypertension:  her blood pressure is controlled to target for her age.   she is advised to continue her current medications including Ramipril 2.5 mg p.o. daily with breakfast.  3) Lipids/Hyperlipidemia:    Review of her recent lipid panel from 06/27/22 showed uncontrolled LDL at 97 and elevated triglycerides of 178.  She is not currently on any lipid lowering medications.    4)  Weight/Diet:  her Body mass index is 29.12 kg/m.  -  clearly complicating her diabetes care.   she is a candidate for weight loss. I discussed with her the fact that loss of 5 - 10% of her  current body weight will have the most impact on her diabetes management.  Exercise, and detailed carbohydrates information provided  -  detailed on discharge instructions.  5) Chronic Care/Health Maintenance: -she is on ACEI/ARB and not on Statin medications and is encouraged to initiate and continue to follow up with Ophthalmology, Dentist, Podiatrist at least yearly or according to recommendations, and advised to stay away from  smoking. I have recommended yearly flu vaccine and pneumonia vaccine at least every 5 years; moderate intensity exercise for up to 150 minutes weekly; and sleep for at least 7 hours a day.  - she is advised to maintain close follow up with Richarda Blade E for primary care needs, as well as her other providers for optimal and coordinated care.      I spent  41  minutes in the care of the patient today including review of labs from CMP, Lipids, Thyroid Function, Hematology (current and previous including abstractions from other facilities); face-to-face time discussing  her blood glucose readings/logs, discussing hypoglycemia and hyperglycemia episodes and symptoms, medications doses, her options of short and long term treatment based on the  latest standards of care / guidelines;  discussion about incorporating lifestyle medicine;  and documenting the encounter. Risk reduction counseling performed per USPSTF guidelines to reduce obesity and cardiovascular risk factors.     Please refer to Patient Instructions for Blood Glucose Monitoring and Insulin/Medications Dosing Guide"  in media tab for additional information. Please  also refer to " Patient Self Inventory" in the Media  tab for reviewed elements of pertinent patient history.  Vickie Ward participated in the discussions, expressed understanding, and voiced agreement with the above plans.  All questions were answered to her satisfaction. she is encouraged to contact clinic should she have any questions or concerns prior to her return visit.     Follow up plan: - Return in about 4 months (around 08/24/2023) for Diabetes F/U with A1c in office, No previsit labs, Bring meter and logs.   Ronny Bacon, [facility] [address] [place], [place] [address] Phone: [phone] Fax: [phone]  04/25/2023, 8:59 AM

## 2023-04-28 ENCOUNTER — Encounter: Payer: Self-pay | Admitting: Nurse Practitioner

## 2023-08-27 ENCOUNTER — Ambulatory Visit: Payer: Medicare Other | Admitting: Nurse Practitioner

## 2023-09-16 ENCOUNTER — Other Ambulatory Visit: Payer: Self-pay | Admitting: Nurse Practitioner

## 2023-09-16 DIAGNOSIS — G4700 Insomnia, unspecified: Secondary | ICD-10-CM

## 2023-09-16 NOTE — Telephone Encounter (Signed)
Pt has been notified, and voices understanding.

## 2023-09-16 NOTE — Telephone Encounter (Signed)
 No, I do not feel comfortable writing this prescription.  I will have to deny this one.

## 2023-09-16 NOTE — Telephone Encounter (Signed)
 This patient needs to contact their PCP correct?

## 2023-09-16 NOTE — Telephone Encounter (Signed)
 Pt is in between PCP, she is asking for Ambien  until she can get into a new PCP

## 2023-11-26 ENCOUNTER — Encounter: Payer: Self-pay | Admitting: Nurse Practitioner

## 2023-11-26 ENCOUNTER — Ambulatory Visit (INDEPENDENT_AMBULATORY_CARE_PROVIDER_SITE_OTHER): Admitting: Nurse Practitioner

## 2023-11-26 VITALS — BP 122/64 | HR 60 | Ht 63.0 in | Wt 170.8 lb

## 2023-11-26 DIAGNOSIS — E559 Vitamin D deficiency, unspecified: Secondary | ICD-10-CM

## 2023-11-26 DIAGNOSIS — E782 Mixed hyperlipidemia: Secondary | ICD-10-CM

## 2023-11-26 DIAGNOSIS — I10 Essential (primary) hypertension: Secondary | ICD-10-CM | POA: Diagnosis not present

## 2023-11-26 DIAGNOSIS — R5382 Chronic fatigue, unspecified: Secondary | ICD-10-CM

## 2023-11-26 DIAGNOSIS — E119 Type 2 diabetes mellitus without complications: Secondary | ICD-10-CM | POA: Diagnosis not present

## 2023-11-26 DIAGNOSIS — Z7984 Long term (current) use of oral hypoglycemic drugs: Secondary | ICD-10-CM

## 2023-11-26 LAB — POCT GLYCOSYLATED HEMOGLOBIN (HGB A1C): Hemoglobin A1C: 7.4 % — AB (ref 4.0–5.6)

## 2023-11-26 MED ORDER — RYBELSUS 3 MG PO TABS: 3.0000 mg | ORAL_TABLET | Freq: Every day | ORAL | 1 refills | Status: DC

## 2023-11-26 MED ORDER — METFORMIN HCL ER 750 MG PO TB24: 750.0000 mg | ORAL_TABLET | Freq: Every day | ORAL | 1 refills | Status: DC

## 2023-11-26 NOTE — Progress Notes (Signed)
 Endocrinology Follow Up Note       11/26/2023, 11:13 AM   Subjective:    Patient ID: Vickie Ward, female    DOB: 1945/12/24.  Vickie Ward is being seen in follow up after being seen in consultation for management of currently uncontrolled symptomatic diabetes requested by  Elliott, Dianne E.   Past Medical History:  Diagnosis Date   A-fib (HCC)    Anemia    Anxiety    Arthritis    Cervical myelopathy (HCC) 02/04/2018   Depression    Diabetes mellitus without complication (HCC)    Factor V Leiden (HCC)    GERD (gastroesophageal reflux disease)    pt takes OTC meds   Headache(784.0)    Insomnia    Numbness and tingling    in L arm from neck issues   Osteoporosis    Overactive bladder    takes Vesicare   PONV (postoperative nausea and vomiting)    RBBB    Sleep apnea    sleep study done > 42yrs ago;no cpap and since lost weight much improvement   UTI (lower urinary tract infection)    currently taking Cipro   Visual floaters     Past Surgical History:  Procedure Laterality Date   ABDOMINAL HYSTERECTOMY     ANTERIOR CERVICAL DECOMP/DISCECTOMY FUSION N/A 03/18/2013   Procedure: ACDF C4 - C7 3 LEVELS;  Surgeon: Donaciano Sprang, MD;  Location: [facility];  Service: Orthopedics;  Laterality: N/A;   COLONOSCOPY     DILATION AND CURETTAGE OF UTERUS     REPLACEMENT TOTAL KNEE     2019   TONSILLECTOMY     adenoidectomy   TOTAL KNEE ARTHROPLASTY Right 02/19/2023   Procedure: TOTAL KNEE ARTHROPLASTY;  Surgeon: Duwayne Purchase, MD;  Location: [facility];  Service: Orthopedics;  Laterality: Right;   UPPER GI ENDOSCOPY      Social History   Socioeconomic History   Marital status: Married    Spouse name: Not on file   Number of children: Not on file   Years of education: Not on file   Highest education level: Not on file  Occupational History   Occupation: Retired  Tobacco Use   Smoking status: Never    Smokeless tobacco: Never  Vaping Use   Vaping status: Never Used  Substance and Sexual Activity   Alcohol use: Yes    Alcohol/week: 1.0 - 2.0 standard drink of alcohol    Types: 1 - 2 Glasses of wine per week    Comment: daily   Drug use: Never   Sexual activity: Not on file  Other Topics Concern   Not on file  Social History Narrative   Not on file   Social Drivers of Health   Financial Resource Strain: Not on file  Food Insecurity: No Food Insecurity (02/19/2023)   Hunger Vital Sign    Worried About Running Out of Food in the Last Year: Never true    Ran Out of Food in the Last Year: Never true  Transportation Needs: No Transportation Needs (02/19/2023)   PRAPARE - Administrator, Civil Service (Medical): No    Lack of Transportation (Non-Medical): No  Physical Activity: Not on file  Stress: Not on file  Social Connections: Not on file    Family History  Problem Relation Age of Onset   Heart attack Mother    Hypertension Mother    Diabetes type II Mother    High Cholesterol Mother    Hyperlipidemia Mother    Heart failure Mother    Diabetes type II Father    Lung cancer Father    Hypertension Sister    Vascular Disease Sister    Diabetes type II Sister    COPD Brother    Drug abuse Brother    Drug abuse Brother    Stomach cancer Brother     Outpatient Encounter Medications as of 11/26/2023  Medication Sig   aspirin  EC 81 MG tablet Take 1 tablet (81 mg total) by mouth 2 (two) times daily after a meal. Start the day after surgery. (Patient taking differently: Take 81 mg by mouth daily. Start the day after surgery.)   Cholecalciferol  (VITAMIN D ) 50 MCG (2000 UT) CAPS Take 2,000 Units by mouth daily.   cyanocobalamin  (,VITAMIN B-12,) 1000 MCG/ML injection Inject 1,000 mcg into the muscle every 30 (thirty) days.   glucose blood (ONETOUCH ULTRA) test strip Use as instructed to monitor glucose twice daily   hydroxychloroquine (PLAQUENIL) 200 MG tablet  Take 200 mg by mouth daily.   Krill Oil 500 MG CAPS Take 500 mg by mouth daily.   magnesium  oxide (MAG-OX) 400 (240 Mg) MG tablet Take 400 mg by mouth 2 (two) times daily.   Melatonin-Pyridoxine  10-10 MG TBCR Take 1 tablet by mouth daily as needed (sleep).   nitroGLYCERIN  (NITROSTAT ) 0.4 MG SL tablet nitroglycerin  0.4 mg sublingual tablet  TABLET 0.4MG  UNDER THE TONGUE EVERY FIVE MINUTES AS NEEDED FOR CHEST PAIN. DO NOT EXCEED THREE DOSES IN 15 MINUTES   ramipril  (ALTACE ) 2.5 MG capsule Take 2.5 mg by mouth daily.   Semaglutide  (RYBELSUS ) 3 MG TABS Take 1 tablet (3 mg total) by mouth daily.   zinc  gluconate 50 MG tablet Take 50 mg by mouth daily.   zolpidem  (AMBIEN ) 10 MG tablet Take 10 mg by mouth at bedtime.   [DISCONTINUED] cyanocobalamin  1000 MCG tablet Take 1,000 mcg by mouth daily. (Patient taking differently: Take 1,000 mcg by mouth daily. Patient states that she takes as needed)   [DISCONTINUED] glipiZIDE  (GLUCOTROL  XL) 2.5 MG 24 hr tablet Take 1 tablet (2.5 mg total) by mouth daily with breakfast.   [DISCONTINUED] metFORMIN  (GLUCOPHAGE -XR) 750 MG 24 hr tablet Take 1 tablet (750 mg total) by mouth daily with breakfast.   metFORMIN  (GLUCOPHAGE -XR) 750 MG 24 hr tablet Take 1 tablet (750 mg total) by mouth daily with breakfast.   [DISCONTINUED] docusate sodium  (COLACE) 100 MG capsule Take 1 capsule (100 mg total) by mouth 2 (two) times daily.   [DISCONTINUED] estradiol (ESTRACE) 0.1 MG/GM vaginal cream estradiol 0.01% (0.1 mg/gram) vaginal cream  apply a pea sized AMOUNT VAGINALLY (bladder opening at the vagina) AS DIRECTED NIGHTLY FOR SIX WEEKS, THEN TWICE A WEEK FOR maintenance (Patient not taking: Reported on 04/25/2023)   [DISCONTINUED] methocarbamol  (ROBAXIN ) 500 MG tablet Take 1 tablet (500 mg total) by mouth every 8 (eight) hours as needed for muscle spasms.   [DISCONTINUED] oxybutynin  (DITROPAN -XL) 5 MG 24 hr tablet Take 5 mg by mouth every morning.   [DISCONTINUED] oxyCODONE  (OXY  IR/ROXICODONE ) 5 MG immediate release tablet Take 1 tablet (5 mg total) by mouth every 4 (four) hours as needed for severe pain (  pain score 7-10).   [DISCONTINUED] polyethylene glycol powder (GLYCOLAX /MIRALAX ) 17 GM/SCOOP powder Mix 17 g in 4 oz of water  or juice & drink by mouth daily.   [DISCONTINUED] rosuvastatin  (CRESTOR ) 5 MG tablet Take 5 mg by mouth daily.   No facility-administered encounter medications on file as of 11/26/2023.    ALLERGIES: Allergies  Allergen Reactions   Cephalexin Other (See Comments) and Rash    Per patient, may have caused itching or swelling Other reaction(s): Other (See Comments) Pt. States of the face Other Reaction: Other reaction Per patient, may have caused itching or swelling    Codeine Nausea And Vomiting   Nitrofurantoin Rash    Other reaction(s): Other (See Comments) Mouth Ulcers    Macrobid [Nitrofurantoin Macrocrystal] Other (See Comments)    Per patient, may have caused itching or swelling   Sulfamethoxazole-Trimethoprim     Other reaction(s): Other (See Comments) Oral thrush    VACCINATION STATUS:  There is no immunization history on file for this patient.  Diabetes She presents for her follow-up diabetic visit. She has type 2 diabetes mellitus. Onset time: Diagnosed at approx age of 13. Her disease course has been improving. There are no hypoglycemic associated symptoms. Associated symptoms include fatigue. Pertinent negatives for diabetes include no weakness. There are no hypoglycemic complications. Symptoms are stable. There are no diabetic complications. Risk factors for coronary artery disease include diabetes mellitus, dyslipidemia, family history, hypertension, stress, sedentary lifestyle and post-menopausal. Current diabetic treatment includes oral agent (dual therapy). She is compliant with treatment most of the time. Her weight is fluctuating minimally. She is following a generally unhealthy diet. When asked about meal planning,  she reported none. She has not had a previous visit with a dietitian. She rarely participates in exercise. Her breakfast blood glucose range is generally 180-200 mg/dl. (She presents today with her meter and logs, showing above target glycemic profile overall.  Her POCT A1c today is 7.4%, improving from last visit of 7.9%.  She denies any hypoglycemia.  She is concerned about some weight gain.  ) An ACE inhibitor/angiotensin II receptor blocker is being taken. She does not see a podiatrist.Eye exam is current.    Review of systems  Constitutional: + Minimally fluctuating body weight,  current Body mass index is 30.26 kg/m. , no fatigue, no subjective hyperthermia, no subjective hypothermia Eyes: no blurry vision, no xerophthalmia ENT: no sore throat, no nodules palpated in throat, no dysphagia/odynophagia, no hoarseness Cardiovascular: no chest pain, no shortness of breath, no palpitations, no leg swelling Respiratory: no cough, no shortness of breath Gastrointestinal: no nausea/vomiting/diarrhea Musculoskeletal: no muscle/joint aches Skin: no rashes, no hyperemia Neurological: no tremors, no numbness, no tingling, no dizziness Psychiatric: no depression, no anxiety  Objective:     BP 122/64 (BP Location: Left Arm, Patient Position: Sitting, Cuff Size: Large)   Pulse 60   Ht 5' 3 (1.6 m)   Wt 170 lb 12.8 oz (77.5 kg)   BMI 30.26 kg/m   Wt Readings from Last 3 Encounters:  11/26/23 170 lb 12.8 oz (77.5 kg)  04/25/23 164 lb 6.4 oz (74.6 kg)  02/19/23 170 lb 13.7 oz (77.5 kg)     BP Readings from Last 3 Encounters:  11/26/23 122/64  04/25/23 139/68  02/20/23 (!) 155/62       Physical Exam- Limited  Constitutional:  Body mass index is 30.26 kg/m. , not in acute distress, normal state of mind Eyes:  EOMI, no exophthalmos Musculoskeletal: no gross deformities, strength intact  in all four extremities, no gross restriction of joint movements Skin:  no rashes, no  hyperemia Neurological: no tremor with outstretched hands   Diabetic Foot Exam - Simple   No data filed     CMP ( most recent) CMP     Component Value Date/Time   NA 134 (L) 02/20/2023 0341   K 4.7 02/20/2023 0341   CL 105 02/20/2023 0341   CO2 22 02/20/2023 0341   GLUCOSE 212 (H) 02/20/2023 0341   BUN 20 02/20/2023 0341   BUN 21 12/17/2021 0000   CREATININE 0.70 02/20/2023 0341   CALCIUM  8.7 (L) 02/20/2023 0341   GFRNONAA >60 02/20/2023 0341     Diabetic Labs (most recent): Lab Results  Component Value Date   HGBA1C 7.4 (A) 11/26/2023   HGBA1C 7.3 (A) 04/25/2023   HGBA1C 7.6 (A) 01/24/2023   MICROALBUR 0.9 12/17/2021   MICROALBUR 10 07/31/2021     Lipid Panel ( most recent) Lipid Panel     Component Value Date/Time   TRIG 145 12/17/2021 0000   LDLCALC 110 12/17/2021 0000      Lab Results  Component Value Date   TSH 1.510 07/17/2021   FREET4 1.05 07/17/2021           Assessment & Plan:   1) Type 2 diabetes mellitus without complication, without long-term current use of insulin  (HCC)  She presents today with her meter and logs, showing above target glycemic profile overall.  Her POCT A1c today is 7.4%, improving from last visit of 7.9%.  She denies any hypoglycemia.  She is concerned about some weight gain.    - Vickie Ward has currently uncontrolled symptomatic type 2 DM since 78 years of age.   -Recent labs reviewed.  - I had a long discussion with her about the progressive nature of diabetes and the pathology behind its complications. -her diabetes is not currently complicated but she remains at a high risk for more acute and chronic complications which include CAD, CVA, CKD, retinopathy, and neuropathy. These are all discussed in detail with her.  The following Lifestyle Medicine recommendations according to American College of Lifestyle Medicine [facility]) were discussed and offered to patient and she agrees to start the journey:  A. Whole  Foods, Plant-based plate comprising of fruits and vegetables, plant-based proteins, whole-grain carbohydrates was discussed in detail with the patient.   A list for source of those nutrients were also provided to the patient.  Patient will use only water  or unsweetened tea for hydration. B.  The need to stay away from risky substances including alcohol, smoking; obtaining 7 to 9 hours of restorative sleep, at least 150 minutes of moderate intensity exercise weekly, the importance of healthy social connections,  and stress reduction techniques were discussed. C.  A full color page of  Calorie density of various food groups per pound showing examples of each food groups was provided to the patient.  - Nutritional counseling repeated at each appointment due to patients tendency to fall back in to old habits.  - The patient admits there is a room for improvement in their diet and drink choices. -  Suggestion is made for the patient to avoid simple carbohydrates from their diet including Cakes, Sweet Desserts / Pastries, Ice Cream, Soda (diet and regular), Sweet Tea, Candies, Chips, Cookies, Sweet Pastries, Store Bought Juices, Alcohol in Excess of 1-2 drinks a day, Artificial Sweeteners, Coffee Creamer, and Sugar-free Products. This will help patient to have stable blood glucose  profile and potentially avoid unintended weight gain.   - I encouraged the patient to switch to unprocessed or minimally processed complex starch and increased protein intake (animal or plant source), fruits, and vegetables.   - Patient is advised to stick to a routine mealtimes to eat 3 meals a day and avoid unnecessary snacks (to snack only to correct hypoglycemia).  - she is being followed by Santana Duke, RDN, CDE for diabetes education- had an appointment with her today.  - I have approached her with the following individualized plan to manage her diabetes and patient agrees:   -She is advised to continue her Metformin   750 mg ER po daily with breakfast.  Will trial Rybelsus  3 mg po daily before breakfast and stop the Glipizide  2.5 mg XL daily for now.  She is advised to reach out if it is too expensive or if she has unpleasant GI SE from the meds thus we can switch back to the Glipizide  if needed.    -she is encouraged to continue monitoring blood glucose at least twice daily daily, before breakfast and before bed, and to call the clinic if she has readings less than 70 or above 300 for 3 tests in a row.    - Adjustment parameters are given to her for hypo and hyperglycemia in writing.  - Specific targets for  A1c; LDL, HDL, and Triglycerides were discussed with the patient.  2) Blood Pressure /Hypertension:  her blood pressure is controlled to target for her age.   she is advised to continue her current medications as prescribed by her PCP.  3) Lipids/Hyperlipidemia:    Review of her recent lipid panel from 06/17/23 showed uncontrolled LDL at 119.  She is not currently on any lipid lowering medications.    4)  Weight/Diet:  her Body mass index is 30.26 kg/m.  -  clearly complicating her diabetes care.   she is a candidate for weight loss. I discussed with her the fact that loss of 5 - 10% of her  current body weight will have the most impact on her diabetes management.  Exercise, and detailed carbohydrates information provided  -  detailed on discharge instructions.  5) Chronic Care/Health Maintenance: -she is on ACEI/ARB and not on Statin medications and is encouraged to initiate and continue to follow up with Ophthalmology, Dentist, Podiatrist at least yearly or according to recommendations, and advised to stay away from smoking. I have recommended yearly flu vaccine and pneumonia vaccine at least every 5 years; moderate intensity exercise for up to 150 minutes weekly; and sleep for at least 7 hours a day.  - she is advised to maintain close follow up with Elliott, Dianne E for primary care needs, as well as  her other providers for optimal and coordinated care.     I spent  32  minutes in the care of the patient today including review of labs from CMP, Lipids, Thyroid  Function, Hematology (current and previous including abstractions from other facilities); face-to-face time discussing  her blood glucose readings/logs, discussing hypoglycemia and hyperglycemia episodes and symptoms, medications doses, her options of short and long term treatment based on the latest standards of care / guidelines;  discussion about incorporating lifestyle medicine;  and documenting the encounter. Risk reduction counseling performed per USPSTF guidelines to reduce obesity and cardiovascular risk factors.     Please refer to Patient Instructions for Blood Glucose Monitoring and Insulin /Medications Dosing Guide  in media tab for additional information. Please  also  refer to  Patient Self Inventory in the Media  tab for reviewed elements of pertinent patient history.  Vickie Ward participated in the discussions, expressed understanding, and voiced agreement with the above plans.  All questions were answered to her satisfaction. she is encouraged to contact clinic should she have any questions or concerns prior to her return visit.     Follow up plan: - Return in about 4 months (around 03/28/2024) for Diabetes F/U with A1c in office, No previsit labs, Bring meter and logs.   Benton Rio, [facility] [address] [place], [place] [address] Phone: [phone] Fax: [phone]  11/26/2023, 11:13 AM

## 2024-02-17 ENCOUNTER — Other Ambulatory Visit: Payer: Self-pay | Admitting: Nurse Practitioner

## 2024-02-23 LAB — COLOGUARD: COLOGUARD: NEGATIVE

## 2024-03-29 ENCOUNTER — Ambulatory Visit (INDEPENDENT_AMBULATORY_CARE_PROVIDER_SITE_OTHER): Admitting: Nurse Practitioner

## 2024-03-29 ENCOUNTER — Encounter: Payer: Self-pay | Admitting: Nurse Practitioner

## 2024-03-29 VITALS — BP 126/60 | HR 60 | Ht 63.0 in | Wt 170.8 lb

## 2024-03-29 DIAGNOSIS — E119 Type 2 diabetes mellitus without complications: Secondary | ICD-10-CM | POA: Diagnosis not present

## 2024-03-29 DIAGNOSIS — E559 Vitamin D deficiency, unspecified: Secondary | ICD-10-CM | POA: Diagnosis not present

## 2024-03-29 DIAGNOSIS — I10 Essential (primary) hypertension: Secondary | ICD-10-CM

## 2024-03-29 DIAGNOSIS — E782 Mixed hyperlipidemia: Secondary | ICD-10-CM

## 2024-03-29 DIAGNOSIS — Z7984 Long term (current) use of oral hypoglycemic drugs: Secondary | ICD-10-CM

## 2024-03-29 LAB — POCT GLYCOSYLATED HEMOGLOBIN (HGB A1C): HbA1c, POC (controlled diabetic range): 7.7 % — AB (ref 0.0–7.0)

## 2024-03-29 MED ORDER — METFORMIN HCL ER 750 MG PO TB24: 750.0000 mg | ORAL_TABLET | Freq: Every day | ORAL | 1 refills | Status: AC

## 2024-03-29 NOTE — Progress Notes (Signed)
 Endocrinology Follow Up Note       03/29/2024, 11:15 AM   Subjective:    Patient ID: Vickie Ward, female    DOB: 1945-05-01.  Vickie Ward is being seen in follow up after being seen in consultation for management of currently uncontrolled symptomatic diabetes requested by  Elliott, Dianne E.   Past Medical History:  Diagnosis Date   A-fib (HCC)    Anemia    Anxiety    Arthritis    Cervical myelopathy (HCC) 02/04/2018   Depression    Diabetes mellitus without complication (HCC)    Factor V Leiden    GERD (gastroesophageal reflux disease)    pt takes OTC meds   Headache(784.0)    Insomnia    Numbness and tingling    in L arm from neck issues   Osteoporosis    Overactive bladder    takes Vesicare   PONV (postoperative nausea and vomiting)    RBBB    Sleep apnea    sleep study done > 24yrs ago;no cpap and since lost weight much improvement   UTI (lower urinary tract infection)    currently taking Cipro   Visual floaters     Past Surgical History:  Procedure Laterality Date   ABDOMINAL HYSTERECTOMY     ANTERIOR CERVICAL DECOMP/DISCECTOMY FUSION N/A 03/18/2013   Procedure: ACDF C4 - C7 3 LEVELS;  Surgeon: Donaciano Sprang, MD;  Location: [facility];  Service: Orthopedics;  Laterality: N/A;   COLONOSCOPY     DILATION AND CURETTAGE OF UTERUS     REPLACEMENT TOTAL KNEE     2019   TONSILLECTOMY     adenoidectomy   TOTAL KNEE ARTHROPLASTY Right 02/19/2023   Procedure: TOTAL KNEE ARTHROPLASTY;  Surgeon: Duwayne Purchase, MD;  Location: [facility];  Service: Orthopedics;  Laterality: Right;   UPPER GI ENDOSCOPY      Social History   Socioeconomic History   Marital status: Married    Spouse name: Not on file   Number of children: Not on file   Years of education: Not on file   Highest education level: Not on file  Occupational History   Occupation: Retired  Tobacco Use   Smoking status: Never    Smokeless tobacco: Never  Vaping Use   Vaping status: Never Used  Substance and Sexual Activity   Alcohol use: Yes    Alcohol/week: 1.0 - 2.0 standard drink of alcohol    Types: 1 - 2 Glasses of wine per week    Comment: daily   Drug use: Never   Sexual activity: Not on file  Other Topics Concern   Not on file  Social History Narrative   Not on file   Social Drivers of Health   Financial Resource Strain: Not on file  Food Insecurity: No Food Insecurity (02/19/2023)   Hunger Vital Sign    Worried About Running Out of Food in the Last Year: Never true    Ran Out of Food in the Last Year: Never true  Transportation Needs: No Transportation Needs (02/19/2023)   PRAPARE - Administrator, Civil Service (Medical): No    Lack of Transportation (Non-Medical): No  Physical Activity: Not on file  Stress: Not on file  Social Connections: Not on file    Family History  Problem Relation Age of Onset   Heart attack Mother    Hypertension Mother    Diabetes type II Mother    High Cholesterol Mother    Hyperlipidemia Mother    Heart failure Mother    Diabetes type II Father    Lung cancer Father    Hypertension Sister    Vascular Disease Sister    Diabetes type II Sister    COPD Brother    Drug abuse Brother    Drug abuse Brother    Stomach cancer Brother     Outpatient Encounter Medications as of 03/29/2024  Medication Sig   apixaban (ELIQUIS) 5 MG TABS tablet Take 5 mg by mouth 2 (two) times daily.   omeprazole (PRILOSEC) 20 MG capsule Take 20 mg by mouth daily.   Cholecalciferol  (VITAMIN D ) 50 MCG (2000 UT) CAPS Take 2,000 Units by mouth daily.   cyanocobalamin  (,VITAMIN B-12,) 1000 MCG/ML injection Inject 1,000 mcg into the muscle every 30 (thirty) days.   famotidine (PEPCID) 20 MG tablet Take 20 mg by mouth 2 (two) times daily.   glucose blood (ONETOUCH ULTRA) test strip Use as instructed to monitor glucose twice daily   hydroxychloroquine (PLAQUENIL) 200 MG  tablet Take 200 mg by mouth daily.   Krill Oil 500 MG CAPS Take 500 mg by mouth daily.   magnesium  oxide (MAG-OX) 400 (240 Mg) MG tablet Take 400 mg by mouth 2 (two) times daily.   Melatonin-Pyridoxine  10-10 MG TBCR Take 1 tablet by mouth daily as needed (sleep).   metFORMIN  (GLUCOPHAGE -XR) 750 MG 24 hr tablet Take 1 tablet (750 mg total) by mouth daily with breakfast.   nitroGLYCERIN  (NITROSTAT ) 0.4 MG SL tablet nitroglycerin  0.4 mg sublingual tablet  TABLET 0.4MG  UNDER THE TONGUE EVERY FIVE MINUTES AS NEEDED FOR CHEST PAIN. DO NOT EXCEED THREE DOSES IN 15 MINUTES   ramipril  (ALTACE ) 2.5 MG capsule Take 2.5 mg by mouth daily.   zinc  gluconate 50 MG tablet Take 50 mg by mouth daily.   zolpidem  (AMBIEN ) 10 MG tablet Take 10 mg by mouth at bedtime.   [DISCONTINUED] aspirin  EC 81 MG tablet Take 1 tablet (81 mg total) by mouth 2 (two) times daily after a meal. Start the day after surgery. (Patient taking differently: Take 81 mg by mouth daily. Start the day after surgery.)   [DISCONTINUED] metFORMIN  (GLUCOPHAGE -XR) 750 MG 24 hr tablet Take 1 tablet (750 mg total) by mouth daily with breakfast.   [DISCONTINUED] Semaglutide  (RYBELSUS ) 3 MG TABS Take 1 tablet (3 mg total) by mouth daily. (Patient not taking: Reported on 03/29/2024)   No facility-administered encounter medications on file as of 03/29/2024.    ALLERGIES: Allergies  Allergen Reactions   Cephalexin Other (See Comments) and Rash    Per patient, may have caused itching or swelling Other reaction(s): Other (See Comments) Pt. States of the face Other Reaction: Other reaction Per patient, may have caused itching or swelling    Codeine Nausea And Vomiting   Nitrofurantoin Rash    Other reaction(s): Other (See Comments) Mouth Ulcers    Macrobid [Nitrofurantoin Macrocrystal] Other (See Comments)    Per patient, may have caused itching or swelling   Sulfamethoxazole-Trimethoprim     Other reaction(s): Other (See Comments) Oral thrush     VACCINATION STATUS:  There is no immunization history on file for this patient.  Diabetes  She presents for her follow-up diabetic visit. She has type 2 diabetes mellitus. Onset time: Diagnosed at approx age of 14. Her disease course has been stable. There are no hypoglycemic associated symptoms. Associated symptoms include fatigue. Pertinent negatives for diabetes include no weakness. There are no hypoglycemic complications. Symptoms are stable. There are no diabetic complications. Risk factors for coronary artery disease include diabetes mellitus, dyslipidemia, family history, hypertension, stress, sedentary lifestyle and post-menopausal. Current diabetic treatment includes oral agent (monotherapy). She is compliant with treatment most of the time. Her weight is fluctuating minimally. She is following a generally unhealthy diet. When asked about meal planning, she reported none. She has not had a previous visit with a dietitian. She rarely participates in exercise. Her home blood glucose trend is fluctuating minimally. Her breakfast blood glucose range is generally 140-180 mg/dl. (She presents today with her meter and logs, showing above target fasting glycemic profile overall.  Her POCT A1c today is 7.7%, increasing from last visit of 7.4%.  She denies any hypoglycemia.  She was sick with aspiration pneumonia and was put on course of oral steroids during that time as well.  She has not started taking the Rybelsus  due to cost.  She does have a meeting with her insurance agent to discuss best insurance plans for her in the next year.) An ACE inhibitor/angiotensin II receptor blocker is being taken. She does not see a podiatrist.Eye exam is current.    Review of systems  Constitutional: + Minimally fluctuating body weight,  current Body mass index is 30.26 kg/m. , no fatigue, no subjective hyperthermia, no subjective hypothermia Eyes: no blurry vision, no xerophthalmia ENT: no sore throat, no  nodules palpated in throat, no dysphagia/odynophagia, no hoarseness Cardiovascular: no chest pain, no shortness of breath, no palpitations, no leg swelling Respiratory: + cough- going for lung washing soon, no shortness of breath Gastrointestinal: no nausea/vomiting/diarrhea Musculoskeletal: no muscle/joint aches Skin: no rashes, no hyperemia Neurological: no tremors, no numbness, no tingling, no dizziness Psychiatric: no depression, no anxiety  Objective:     BP 126/60   Pulse 60   Ht 5' 3 (1.6 m)   Wt 170 lb 12.8 oz (77.5 kg)   BMI 30.26 kg/m   Wt Readings from Last 3 Encounters:  03/29/24 170 lb 12.8 oz (77.5 kg)  11/26/23 170 lb 12.8 oz (77.5 kg)  04/25/23 164 lb 6.4 oz (74.6 kg)     BP Readings from Last 3 Encounters:  03/29/24 126/60  11/26/23 122/64  04/25/23 139/68      Physical Exam- Limited  Constitutional:  Body mass index is 30.26 kg/m. , not in acute distress, normal state of mind Eyes:  EOMI, no exophthalmos Musculoskeletal: no gross deformities, strength intact in all four extremities, no gross restriction of joint movements Skin:  no rashes, no hyperemia Neurological: no tremor with outstretched hands   Diabetic Foot Exam - Simple   No data filed     CMP ( most recent) CMP     Component Value Date/Time   NA 134 (L) 02/20/2023 0341   K 4.7 02/20/2023 0341   CL 105 02/20/2023 0341   CO2 22 02/20/2023 0341   GLUCOSE 212 (H) 02/20/2023 0341   BUN 20 02/20/2023 0341   BUN 21 12/17/2021 0000   CREATININE 0.70 02/20/2023 0341   CALCIUM  8.7 (L) 02/20/2023 0341   GFRNONAA >60 02/20/2023 0341     Diabetic Labs (most recent): Lab Results  Component Value Date   HGBA1C 7.7 (A)  03/29/2024   HGBA1C 7.4 (A) 11/26/2023   HGBA1C 7.3 (A) 04/25/2023   MICROALBUR 0.9 12/17/2021   MICROALBUR 10 07/31/2021     Lipid Panel ( most recent) Lipid Panel     Component Value Date/Time   TRIG 145 12/17/2021 0000   LDLCALC 110 12/17/2021 0000       Lab Results  Component Value Date   TSH 1.510 07/17/2021   FREET4 1.05 07/17/2021           Assessment & Plan:   1) Type 2 diabetes mellitus without complication, without long-term current use of insulin  (HCC)  She presents today with her meter and logs, showing above target fasting glycemic profile overall.  Her POCT A1c today is 7.7%, increasing from last visit of 7.4%.  She denies any hypoglycemia.  She was sick with aspiration pneumonia and was put on course of oral steroids during that time as well.  She has not started taking the Rybelsus  due to cost.  She does have a meeting with her insurance agent to discuss best insurance plans for her in the next year.  - Vickie Ward has currently uncontrolled symptomatic type 2 DM since 78 years of age.   -Recent labs reviewed.  - I had a long discussion with her about the progressive nature of diabetes and the pathology behind its complications. -her diabetes is not currently complicated but she remains at a high risk for more acute and chronic complications which include CAD, CVA, CKD, retinopathy, and neuropathy. These are all discussed in detail with her.  The following Lifestyle Medicine recommendations according to American College of Lifestyle Medicine [facility]) were discussed and offered to patient and she agrees to start the journey:  A. Whole Foods, Plant-based plate comprising of fruits and vegetables, plant-based proteins, whole-grain carbohydrates was discussed in detail with the patient.   A list for source of those nutrients were also provided to the patient.  Patient will use only water  or unsweetened tea for hydration. B.  The need to stay away from risky substances including alcohol, smoking; obtaining 7 to 9 hours of restorative sleep, at least 150 minutes of moderate intensity exercise weekly, the importance of healthy social connections,  and stress reduction techniques were discussed. C.  A full color page of  Calorie  density of various food groups per pound showing examples of each food groups was provided to the patient.  - Nutritional counseling repeated/built upon at each appointment.  - The patient admits there is a room for improvement in their diet and drink choices. -  Suggestion is made for the patient to avoid simple carbohydrates from their diet including Cakes, Sweet Desserts / Pastries, Ice Cream, Soda (diet and regular), Sweet Tea, Candies, Chips, Cookies, Sweet Pastries, Store Bought Juices, Alcohol in Excess of 1-2 drinks a day, Artificial Sweeteners, Coffee Creamer, and Sugar-free Products. This will help patient to have stable blood glucose profile and potentially avoid unintended weight gain.   - I encouraged the patient to switch to unprocessed or minimally processed complex starch and increased protein intake (animal or plant source), fruits, and vegetables.   - Patient is advised to stick to a routine mealtimes to eat 3 meals a day and avoid unnecessary snacks (to snack only to correct hypoglycemia).  - she is being followed by Santana Duke, RDN, CDE for diabetes education- had an appointment with her today.  - I have approached her with the following individualized plan to manage her diabetes and patient  agrees:   -She is advised to continue her Metformin  750 mg ER po daily with breakfast.   She could not afford the copay for Rybelsus , thus advised her not to take it until we know if it will be affordable with her new insurance plan.  This medication may exacerbate her GERD, thus may not be the best medication for her.  -she is encouraged to continue monitoring blood glucose at least twice daily daily, before breakfast and before bed, and to call the clinic if she has readings less than 70 or above 300 for 3 tests in a row.    - Adjustment parameters are given to her for hypo and hyperglycemia in writing.  - Specific targets for  A1c; LDL, HDL, and Triglycerides were discussed with  the patient.  2) Blood Pressure /Hypertension:  her blood pressure is controlled to target for her age.   she is advised to continue her current medications as prescribed by her PCP.  3) Lipids/Hyperlipidemia:    Review of her recent lipid panel from 06/17/23 showed uncontrolled LDL at 119.  She is not currently on any lipid lowering medications.    4)  Weight/Diet:  her Body mass index is 30.26 kg/m.  -  clearly complicating her diabetes care.   she is a candidate for weight loss. I discussed with her the fact that loss of 5 - 10% of her  current body weight will have the most impact on her diabetes management.  Exercise, and detailed carbohydrates information provided  -  detailed on discharge instructions.  5) Chronic Care/Health Maintenance: -she is on ACEI/ARB and not on Statin medications and is encouraged to initiate and continue to follow up with Ophthalmology, Dentist, Podiatrist at least yearly or according to recommendations, and advised to stay away from smoking. I have recommended yearly flu vaccine and pneumonia vaccine at least every 5 years; moderate intensity exercise for up to 150 minutes weekly; and sleep for at least 7 hours a day.  - she is advised to maintain close follow up with Elliott, Dianne E for primary care needs, as well as her other providers for optimal and coordinated care.     I spent  41  minutes in the care of the patient today including review of labs from CMP, Lipids, Thyroid  Function, Hematology (current and previous including abstractions from other facilities); face-to-face time discussing  her blood glucose readings/logs, discussing hypoglycemia and hyperglycemia episodes and symptoms, medications doses, her options of short and long term treatment based on the latest standards of care / guidelines;  discussion about incorporating lifestyle medicine;  and documenting the encounter. Risk reduction counseling performed per USPSTF guidelines to reduce obesity  and cardiovascular risk factors.     Please refer to Patient Instructions for Blood Glucose Monitoring and Insulin /Medications Dosing Guide  in media tab for additional information. Please  also refer to  Patient Self Inventory in the Media  tab for reviewed elements of pertinent patient history.  Vickie Ward participated in the discussions, expressed understanding, and voiced agreement with the above plans.  All questions were answered to her satisfaction. she is encouraged to contact clinic should she have any questions or concerns prior to her return visit.     Follow up plan: - Return in about 4 months (around 07/28/2024) for Diabetes F/U with A1c in office, No previsit labs.   Benton Rio, [facility] [address] [place], [place] [address] Phone: [phone] Fax: [phone]  03/29/2024,  11:15 AM

## 2024-05-06 ENCOUNTER — Other Ambulatory Visit: Payer: Self-pay | Admitting: Nurse Practitioner

## 2024-05-14 ENCOUNTER — Ambulatory Visit: Admitting: Cardiology

## 2024-05-19 ENCOUNTER — Other Ambulatory Visit (HOSPITAL_COMMUNITY): Payer: Self-pay

## 2024-05-19 ENCOUNTER — Encounter: Payer: Self-pay | Admitting: Cardiology

## 2024-05-19 ENCOUNTER — Ambulatory Visit: Admitting: Cardiology

## 2024-05-19 VITALS — BP 109/74 | HR 49 | Ht 62.0 in | Wt 171.5 lb

## 2024-05-19 DIAGNOSIS — Z7901 Long term (current) use of anticoagulants: Secondary | ICD-10-CM

## 2024-05-19 DIAGNOSIS — E1165 Type 2 diabetes mellitus with hyperglycemia: Secondary | ICD-10-CM

## 2024-05-19 DIAGNOSIS — I480 Paroxysmal atrial fibrillation: Secondary | ICD-10-CM

## 2024-05-19 DIAGNOSIS — I10 Essential (primary) hypertension: Secondary | ICD-10-CM | POA: Diagnosis not present

## 2024-05-19 DIAGNOSIS — E7800 Pure hypercholesterolemia, unspecified: Secondary | ICD-10-CM

## 2024-05-19 MED ORDER — APIXABAN 5 MG PO TABS
5.0000 mg | ORAL_TABLET | Freq: Two times a day (BID) | ORAL | 3 refills | Status: AC
  Filled 2024-05-19: qty 180, 90d supply, fill #0

## 2024-05-19 MED ORDER — ATORVASTATIN CALCIUM 10 MG PO TABS
10.0000 mg | ORAL_TABLET | Freq: Every day | ORAL | 3 refills | Status: AC
  Filled 2024-05-19: qty 90, 90d supply, fill #0

## 2024-05-19 MED ORDER — EMPAGLIFLOZIN 10 MG PO TABS
10.0000 mg | ORAL_TABLET | Freq: Every day | ORAL | 2 refills | Status: AC
  Filled 2024-05-19: qty 30, 30d supply, fill #0

## 2024-05-19 NOTE — Progress Notes (Signed)
 " Cardiology Office Note:  .   Date:  05/19/2024  ID:  Vickie Ward, DOB 1946-03-29, MRN [id number] PCP: Viktoria Lawrnce BRAVO  [facility] Providers Cardiologist:  Gordy Bergamo, MD   History of Present Illness: .   Vickie Ward is a 79 y.o. with longstanding diabetes mellitus, hypertension, factor V Leyden carrier with no history of DVT, diagnosed with new onset atrial fibrillation sometime in November 2024 but was not taking Eliquis  regularly due to cost as she did not have insurance coverage for medications.  About 2 months ago she has started taking the Eliquis  again and she was given samples.  She wanted to establish care with cardiology as she is concerned about her cardiovascular health.  States that she has had extensive evaluation when she had a upper respiratory infection and also thinks that she aspirated some protein drink and went to urgent care where she was found to be in A-fib with RVR.  She stayed in the hospital after that for 3 days, appears that she spontaneously converted to sinus rhythm.  She states that she has had a echocardiogram and a stress test and they were told to be normal.  She has known underlying right bundle branch block.  She remains asymptomatic today.    Discussed the use of AI scribe software for clinical note transcription with the patient, who gave verbal consent to proceed.  History of Present Illness Vickie Ward is a 79 year old female with atrial fibrillation and right bundle branch block who presents for cardiology consultation. She was referred by Dr. Chyrl Loving for cardiology evaluation.  She was diagnosed with atrial fibrillation over a year ago after an urgent care EKG and had a three-day hospitalization for testing and rhythm conversion. She has been taking Eliquis  for a couple of months, using samples because of cost. She has not had recent symptoms of atrial fibrillation.  She has a right bundle branch block. 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  About a month ago she had severe acid reflux with aspiration and bacterial lung infection that required a lung wash. She has nodularity in the right middle and lower lung lobes.  She has diabetes treated with Metformin  ER 750 mg and Glipizide , managed by an endocrinologist for about two years. Prior nausea and bloating from Metformin  have improved.  She has iron deficiency anemia and gives herself B12 injections at home. She has had colonoscopies and is planning GI follow-up for acid-related problems.  She does not smoke. She is less physically active than desired. She lives in [place], [place] , and travels to [place] for medical appointments. She has no current atrial fibrillation symptoms.  Cardiac Studies relevent.       EKG:   EKG Interpretation Date/Time:  Wednesday May 19 2024 10:24:35 EST Ventricular Rate:  49 PR Interval:  156 QRS Duration:  120 QT Interval:  488 QTC Calculation: 440 R Axis:   -35  Text Interpretation: EKG 05/19/2024: Sinus bradycardia at rate of 49 bpm, left anterior fascicular block.  Right bundle branch block.  LVH with repolarization abnormality, cannot exclude lateral ischemia, inferior ischemia.  Abnormal EKG. Confirmed by Joell Usman, Jagadeesh [phone]) on 05/19/2024 10:31:16 AM  Labs   Care everywhere/Faxed External Labs:  Labs 12/22/2023:  A1c 7.7%.  TSH normal at 1.07.  Hb 9.6/HCT 32.8, platelets 327.  Microcytic indicis.  BUN 16, creatinine 0.67, potassium 4.8, LFTs  normal.  Total cholesterol 217, triglycerides 102, HDL 97, LDL 100.  Non-HDL cholesterol 120.  ROS  Review of Systems  Cardiovascular:  Negative for chest pain, dyspnea on exertion and leg swelling.   Physical Exam:   VS:  BP 109/74 (BP Location: Left Arm, Patient Position: Sitting, Cuff Size: Normal)   Pulse (!) 49   Ht 5' 2 (1.575 m)   Wt 171 lb 8 oz (77.8 kg)   SpO2 98%    BMI 31.37 kg/m    Wt Readings from Last 3 Encounters:  05/19/24 171 lb 8 oz (77.8 kg)  03/29/24 170 lb 12.8 oz (77.5 kg)  11/26/23 170 lb 12.8 oz (77.5 kg)    BP Readings from Last 3 Encounters:  05/19/24 109/74  03/29/24 126/60  11/26/23 122/64   Physical Exam Neck:     Vascular: No carotid bruit or JVD.  Cardiovascular:     Rate and Rhythm: Regular rhythm. Bradycardia present.     Pulses: Intact distal pulses.     Heart sounds: Normal heart sounds. No murmur heard.    No gallop.  Pulmonary:     Effort: Pulmonary effort is normal.     Breath sounds: Normal breath sounds.  Abdominal:     General: Bowel sounds are normal.     Palpations: Abdomen is soft.  Musculoskeletal:     Right lower leg: No edema.     Left lower leg: No edema.     ASSESSMENT AND PLAN: .      ICD-10-CM   1. Paroxysmal atrial fibrillation (HCC)  I48.0 apixaban  (ELIQUIS ) 5 MG TABS tablet    2. Primary hypertension  I10 EKG 12-Lead    3. Pure hypercholesterolemia  E78.00 EKG 12-Lead    atorvastatin  (LIPITOR) 10 MG tablet    Lipid Panel With LDL/HDL Ratio    Lipid Panel With LDL/HDL Ratio    4. Chronic anticoagulation  Z79.01 EKG 12-Lead    5. Type 2 diabetes mellitus with hyperglycemia, without long-term current use of insulin  (HCC)  E11.65 empagliflozin  (JARDIANCE ) 10 MG TABS tablet    atorvastatin  (LIPITOR) 10 MG tablet     Assessment & Plan Paroxysmal atrial fibrillation Diagnosed over a year ago, initially triggered by a protein drink. Managed with Eliquis , but cost was prohibitive. Currently on Eliquis  with samples. No recent symptoms reported. Discussed the importance of anticoagulation to prevent stroke, especially given her carrier status for factor V Leiden. - Prescribed Eliquis  5 mg twice daily for one year with a maximum out-of-pocket cost of $2000 per year. - Requested echocardiogram and stress test results from previous cardiologist. - Scheduled follow-up in two months to review  test results and assess atrial fibrillation management.  Type 2 diabetes mellitus with hyperglycemia Managed with metformin  and glipizide . Experiencing side effects from metformin , including diarrhea and bloating. Discussed starting Jardiance  to improve glycemic control and aid in weight loss. Advised on potential side effects of Jardiance , including increased risk of UTIs due to glycosuria. - Prescribed Jardiance  10 mg with a 30-day supply and two refills. - Advised on maintaining good hygiene to prevent UTIs. - Encouraged follow-up with endocrinologist in two months for diabetes management. - Reduce the dose of glipizide  2.5 mg daily to 1/2 tablet daily until seen by endocrinologist.  Pure hypercholesterolemia Mildly elevated cholesterol levels. Discussed starting a low-dose statin to manage cholesterol levels, especially given her diabetic status. - Prescribed a low-dose statin for cholesterol management. - Ordered blood work for cholesterol levels to be done before  next visit. - High allergies listed for atorvastatin  and rosuvastatin  but on questioning patient, patient states that she has never tried any cholesterol medications.  I started her on Lipitor 10 mg daily.  Goal LDL <70.  Follow up: 2 months. PAF, Hypertension and Hyperchol.  Extremely complex patient with  Signed,  Gordy Bergamo, MD, [facility] 05/19/2024, 11:13 AM [facility] [address] [place], [place] [address] Phone: [phone]. Fax:  [phone]  "

## 2024-05-19 NOTE — Patient Instructions (Addendum)
 Medication Instructions:  START   apixaban  (ELIQUIS ) 5 MG TABS tablet        Take 1 tablet (5 mg total) by mouth 2 (two) times daily., Starting Wed 05/19/2024, Normal     atorvastatin  (LIPITOR) 10 MG tablet        Take 1 tablet (10 mg total) by mouth daily., Starting Wed 05/19/2024, Until Tue 08/17/2024, Normal     empagliflozin  (JARDIANCE ) 10 MG TABS tablet        Take 1 tablet (10 mg total) by mouth daily   *If you need a refill on your cardiac medications before your next appointment, please call your pharmacy*  Follow-Up: At [facility], you and your health needs are our priority.  As part of our continuing mission to provide you with exceptional heart care, our providers are all part of one team.  This team includes your primary Cardiologist (physician) and Advanced Practice Providers or APPs (Physician Assistants and Nurse Practitioners) who all work together to provide you with the care you need, when you need it.  Your next appointment:    We recommend signing up for the patient portal called MyChart.  Sign up information is provided on this After Visit Summary.  MyChart is used to connect with patients for Virtual Visits (Telemedicine).  Patients are able to view lab/test results, encounter notes, upcoming appointments, etc.  Non-urgent messages can be sent to your provider as well.   To learn more about what you can do with MyChart, go to [email or web address].

## 2024-05-20 ENCOUNTER — Other Ambulatory Visit: Payer: Self-pay

## 2024-05-20 ENCOUNTER — Other Ambulatory Visit (HOSPITAL_COMMUNITY): Payer: Self-pay

## 2024-07-22 ENCOUNTER — Ambulatory Visit: Admitting: Cardiology

## 2024-07-29 ENCOUNTER — Ambulatory Visit: Admitting: Nurse Practitioner
